# Patient Record
Sex: Male | Born: 1965
Health system: Southern US, Community
[De-identification: ages and names within clinical notes are randomized; demographics above are authoritative.]

## PROBLEM LIST (undated history)

## (undated) DIAGNOSIS — M109 Gout, unspecified: Secondary | ICD-10-CM

## (undated) DIAGNOSIS — I10 Essential (primary) hypertension: Secondary | ICD-10-CM

## (undated) HISTORY — PX: COLONOSCOPY: SHX174

---

## 2014-01-14 DIAGNOSIS — Z792 Long term (current) use of antibiotics: Secondary | ICD-10-CM | POA: Insufficient documentation

## 2014-01-14 DIAGNOSIS — M7989 Other specified soft tissue disorders: Secondary | ICD-10-CM | POA: Insufficient documentation

## 2014-01-14 DIAGNOSIS — L03119 Cellulitis of unspecified part of limb: Principal | ICD-10-CM

## 2014-01-14 DIAGNOSIS — Z791 Long term (current) use of non-steroidal anti-inflammatories (NSAID): Secondary | ICD-10-CM | POA: Insufficient documentation

## 2014-01-14 DIAGNOSIS — B353 Tinea pedis: Secondary | ICD-10-CM | POA: Insufficient documentation

## 2014-01-14 DIAGNOSIS — L02619 Cutaneous abscess of unspecified foot: Secondary | ICD-10-CM | POA: Insufficient documentation

## 2014-01-15 ENCOUNTER — Emergency Department (HOSPITAL_BASED_OUTPATIENT_CLINIC_OR_DEPARTMENT_OTHER)
Admission: EM | Admit: 2014-01-15 | Discharge: 2014-01-15 | Disposition: A | Discharge status: Home / Self Care | Payer: Self-pay | Attending: Emergency Medicine | Admitting: Emergency Medicine

## 2014-01-15 ENCOUNTER — Encounter (HOSPITAL_BASED_OUTPATIENT_CLINIC_OR_DEPARTMENT_OTHER): Payer: Self-pay | Admitting: Emergency Medicine

## 2014-01-15 DIAGNOSIS — L03119 Cellulitis of unspecified part of limb: Secondary | ICD-10-CM

## 2014-01-15 DIAGNOSIS — B353 Tinea pedis: Secondary | ICD-10-CM

## 2014-01-15 MED ORDER — NAPROXEN 375 MG PO TABS: 375.0000 mg | ORAL_TABLET | Freq: Two times a day (BID) | ORAL | Status: DC

## 2014-01-15 MED ORDER — SULFAMETHOXAZOLE-TMP DS 800-160 MG PO TABS
ORAL_TABLET | ORAL | Status: AC
  Administered 2014-01-15: 1
  Filled 2014-01-15: qty 1

## 2014-01-15 MED ORDER — IBUPROFEN 800 MG PO TABS
ORAL_TABLET | ORAL | Status: AC
  Administered 2014-01-15: 800 mg
  Filled 2014-01-15: qty 1

## 2014-01-15 MED ORDER — HYDROCODONE-ACETAMINOPHEN 5-325 MG PO TABS: 1.0000 | ORAL_TABLET | Freq: Four times a day (QID) | ORAL | Status: DC | PRN

## 2014-01-15 MED ORDER — SULFAMETHOXAZOLE-TRIMETHOPRIM 800-160 MG PO TABS: 1.0000 | ORAL_TABLET | Freq: Two times a day (BID) | ORAL | Status: DC

## 2014-01-15 NOTE — ED Notes (Signed)
Pt. Reports having swelling in the R ankle R foot and R lower leg since Sat.   Pt. Has noted moderate edema with mild pain in resting position.

## 2014-01-15 NOTE — Discharge Instructions (Signed)
Athlete's Foot  Athlete's foot is a skin infection caused by a fungus. Athlete's foot is often seen between or under the toes. It can also be seen on the bottom of the foot. Athlete's foot can spread to other people by sharing towels or shower stalls. HOME CARE  Only take medicines as told by your doctor. Do not use steroid creams.  Wash your feet daily. Dry your feet well, especially between the toes.  Change your socks every day. Wear cotton or wool socks.  Change your socks 2 to 3 times a day in hot weather.  Wear sandals or canvas tennis shoes with good airflow.  If you have blisters, soak your feet in a solution as told by your doctor. Do this for 20 to 30 minutes, 2 times a day. Dry your feet well after you soak them.  Do not share towels.  Wear sandals when you use shared locker rooms or showers. GET HELP RIGHT AWAY IF:   You have a fever.  Your foot is puffy (swollen), sore, warm, or red.  You are not getting better after 7 days of treatment.  You still have athlete's foot after 30 days.  You have problems caused by your medicine. MAKE SURE YOU:   Understand these instructions.  Will watch your condition.  Will get help right away if you are not doing well or get worse. Document Released: 10/26/2007 Document Revised: 08/01/2011 Document Reviewed: 02/25/2011 Exit[employer] Patient Information 2015 [employer]. This information is not intended to replace advice given to you by your health care provider. Make sure you discuss any questions you have with your health care provider.  

## 2014-01-15 NOTE — ED Provider Notes (Signed)
CSN: [id number]     Arrival date & time 01/14/14  2348 History   First MD Initiated Contact with Patient 01/15/14 0217     Chief Complaint  Patient presents with  . Leg Swelling     (Consider location/radiation/quality/duration/timing/severity/associated sxs/prior Treatment) Patient is a 48 y.o. male presenting with lower extremity pain. The history is provided by the patient.  Foot Pain This is a new problem. The current episode started more than 2 days ago. The problem occurs constantly. The problem has not changed since onset.Pertinent negatives include no chest pain, no abdominal pain, no headaches and no shortness of breath. Nothing aggravates the symptoms. Nothing relieves the symptoms. Treatments tried: epsom salts. The treatment provided no relief.  Redness of the dorsum of the foot with mild ache and swelling of the dorsum of the foot  History reviewed. No pertinent past medical history. History reviewed. No pertinent past surgical history. History reviewed. No pertinent family history. History  Substance Use Topics  . Smoking status: Never Smoker   . Smokeless tobacco: Not on file  . Alcohol Use: No    Review of Systems  Respiratory: Negative for shortness of breath.   Cardiovascular: Negative for chest pain.  Gastrointestinal: Negative for abdominal pain.  Musculoskeletal: Positive for arthralgias.  Skin: Positive for color change.  Neurological: Negative for headaches.  All other systems reviewed and are negative.     Allergies  Review of patient's allergies indicates no known allergies.  Home Medications   Prior to Admission medications   Medication Sig Start Date End Date Taking? Authorizing Provider  HYDROcodone-acetaminophen (NORCO) 5-325 MG per tablet Take 1-2 tablets by mouth every 6 (six) hours as needed. 01/15/14   April K Palumbo-Rasch, MD  naproxen (NAPROSYN) 375 MG tablet Take 1 tablet (375 mg total) by mouth 2 (two) times daily. 01/15/14   April K  Palumbo-Rasch, MD  sulfamethoxazole-trimethoprim (SEPTRA DS) 800-160 MG per tablet Take 1 tablet by mouth every 12 (twelve) hours. 01/15/14   April K Palumbo-Rasch, MD   BP 179/89  Pulse 62  Temp(Src) 98.5 F (36.9 C) (Oral)  Resp 18  Ht 6' (1.829 m)  Wt 240 lb (108.863 kg)  BMI 32.54 kg/m2  SpO2 100% Physical Exam  Constitutional: He is oriented to person, place, and time. He appears well-developed and well-nourished. No distress.  HENT:  Head: Normocephalic and atraumatic.  Mouth/Throat: Oropharynx is clear and moist.  Eyes: Conjunctivae are normal. Pupils are equal, round, and reactive to light.  Neck: Normal range of motion. Neck supple.  Cardiovascular: Normal rate, regular rhythm and intact distal pulses.   Pulmonary/Chest: Effort normal and breath sounds normal. He has no wheezes. He has no rales.  Abdominal: Soft. Bowel sounds are normal. There is no tenderness. There is no rebound and no guarding.  Musculoskeletal: Normal range of motion.       Right foot: He exhibits normal range of motion, no bony tenderness, normal capillary refill, no crepitus and no deformity.  Fungus of toenails of the right foot, athletes foot with significant cracking between toes of the right foot.  Cellulitis of the dorsum of the foot with warmth and erythema and mild swelling   cap refill < 2 sec right foot NVI achilles tendon intact no pain with motion of the ankle  Skin marked with sterile marking pen  Neurological: He is alert and oriented to person, place, and time. He has normal reflexes.  Skin: Skin is warm and dry. There is erythema.  Psychiatric: He has a normal mood and affect.    ED Course  Procedures (including critical care time) Labs Review Labs Reviewed - No data to display  Imaging Review No results found.   EKG Interpretation None      MDM   Final diagnoses:  Cellulitis of foot  Tinea pedis of right foot    Elevation when not on the foot, warm dial soap soaks QD  take all antibiotics.  Will prescribe NSAIDs for pain while at work and narcotic pain medication for when resting. Patient instructed he cannot drink alcohol drive a car or operate heavy machinery white taking narcotic pain medication.  Have advised patient to return immediately for fever, streaking up the leg worsening swelling numbness or vomiting.  Patient is also instructed to spray feet twice daily with tinactin to cure athletes foot as the cracking is likely how the infection entered into the tissues.  Would also use tinactin powder in shoes.  Patient verbalizes understanding and agrees to follow up    April Smitty Cords, MD 01/15/14 [phone]

## 2016-11-27 ENCOUNTER — Encounter (HOSPITAL_BASED_OUTPATIENT_CLINIC_OR_DEPARTMENT_OTHER): Payer: Self-pay | Admitting: Emergency Medicine

## 2016-11-27 ENCOUNTER — Emergency Department (HOSPITAL_BASED_OUTPATIENT_CLINIC_OR_DEPARTMENT_OTHER): Payer: Self-pay

## 2016-11-27 ENCOUNTER — Emergency Department (HOSPITAL_BASED_OUTPATIENT_CLINIC_OR_DEPARTMENT_OTHER)
Admission: EM | Admit: 2016-11-27 | Discharge: 2016-11-27 | Disposition: A | Discharge status: Home / Self Care | Payer: Self-pay | Attending: Emergency Medicine | Admitting: Emergency Medicine

## 2016-11-27 DIAGNOSIS — R609 Edema, unspecified: Secondary | ICD-10-CM

## 2016-11-27 DIAGNOSIS — R600 Localized edema: Secondary | ICD-10-CM | POA: Insufficient documentation

## 2016-11-27 DIAGNOSIS — R9431 Abnormal electrocardiogram [ECG] [EKG]: Secondary | ICD-10-CM | POA: Insufficient documentation

## 2016-11-27 DIAGNOSIS — Z79899 Other long term (current) drug therapy: Secondary | ICD-10-CM | POA: Insufficient documentation

## 2016-11-27 LAB — COMPREHENSIVE METABOLIC PANEL
ALT: 72 U/L — ABNORMAL HIGH (ref 17–63)
AST: 40 U/L (ref 15–41)
Albumin: 3.5 g/dL (ref 3.5–5.0)
Alkaline Phosphatase: 62 U/L (ref 38–126)
Anion gap: 8 (ref 5–15)
BUN: 13 mg/dL (ref 6–20)
CO2: 33 mmol/L — ABNORMAL HIGH (ref 22–32)
Calcium: 8.6 mg/dL — ABNORMAL LOW (ref 8.9–10.3)
Chloride: 99 mmol/L — ABNORMAL LOW (ref 101–111)
Creatinine, Ser: 1.18 mg/dL (ref 0.61–1.24)
GFR calc Af Amer: >60 mL/min (ref >60)
GFR calc non Af Amer: >60 mL/min (ref >60)
Glucose, Bld: 117 mg/dL — ABNORMAL HIGH (ref 65–99)
Potassium: 3.6 mmol/L (ref 3.5–5.1)
Sodium: 140 mmol/L (ref 135–145)
Total Bilirubin: 0.5 mg/dL (ref 0.3–1.2)
Total Protein: 6.8 g/dL (ref 6.5–8.1)

## 2016-11-27 LAB — CBC WITH DIFFERENTIAL/PLATELET
Basophils Absolute: 0 10*3/uL (ref 0.0–0.1)
Basophils Relative: 0 %
Eosinophils Absolute: 0.3 10*3/uL (ref 0.0–0.7)
Eosinophils Relative: 4 %
HCT: 33.5 % — ABNORMAL LOW (ref 39.0–52.0)
Hemoglobin: 11.3 g/dL — ABNORMAL LOW (ref 13.0–17.0)
Lymphocytes Relative: 19 %
Lymphs Abs: 1.2 10*3/uL (ref 0.7–4.0)
MCH: 29 pg (ref 26.0–34.0)
MCHC: 33.7 g/dL (ref 30.0–36.0)
MCV: 86.1 fL (ref 78.0–100.0)
Monocytes Absolute: 0.5 10*3/uL (ref 0.1–1.0)
Monocytes Relative: 8 %
Neutro Abs: 4.2 10*3/uL (ref 1.7–7.7)
Neutrophils Relative %: 69 %
Platelets: 178 10*3/uL (ref 150–400)
RBC: 3.89 MIL/uL — ABNORMAL LOW (ref 4.22–5.81)
RDW: 19.2 % — ABNORMAL HIGH (ref 11.5–15.5)
WBC: 6.2 10*3/uL (ref 4.0–10.5)

## 2016-11-27 LAB — URINALYSIS, ROUTINE W REFLEX MICROSCOPIC
Bilirubin Urine: NEGATIVE
Glucose, UA: NEGATIVE mg/dL
Hgb urine dipstick: NEGATIVE
Ketones, ur: NEGATIVE mg/dL
Leukocytes, UA: NEGATIVE
Nitrite: NEGATIVE
Protein, ur: NEGATIVE mg/dL
Specific Gravity, Urine: 1.009 (ref 1.005–1.030)
pH: 7 (ref 5.0–8.0)

## 2016-11-27 LAB — TROPONIN I: Troponin I: <0.03 ng/mL (ref <0.03)

## 2016-11-27 MED ORDER — FUROSEMIDE 20 MG PO TABS: 20.0000 mg | ORAL_TABLET | Freq: Every day | ORAL | 0 refills | Status: DC

## 2016-11-27 MED ORDER — FUROSEMIDE 10 MG/ML IJ SOLN
20.0000 mg | Freq: Once | INTRAMUSCULAR | Status: AC
  Administered 2016-11-27: 20 mg via INTRAVENOUS
  Filled 2016-11-27: qty 2

## 2016-11-27 NOTE — ED Provider Notes (Signed)
[facility] Provider Note   CSN: [id number] Arrival date & time: 11/27/16  1546 By signing my name below, I, Levon HedgerElizabeth Hall, attest that this documentation has been prepared under the direction and in the presence of Jacalyn LefevreHaviland, Julie, MD . Electronically Signed: Levon HedgerElizabeth Hall, Scribe. 11/27/2016. 5:35 PM.   History   Chief Complaint Chief Complaint  Patient presents with  . Leg Swelling   HPI Adrian Cole is a 51 y.o. male who presents to the Emergency Department complaining of constant, moderate bialteral ankle swelling onset two days ago. No alleviating or modifying factors noted.  He has never experienced this before. The patient is currently on no regular medications, but reports his recently started on abx per his dentist. Pt denies any leg pain, SOB, abdomen distention, or hand swelling.   The history is provided by the patient. No language interpreter was used.    History reviewed. No pertinent past medical history.  There are no active problems to display for this patient.   History reviewed. No pertinent surgical history.   Home Medications    Prior to Admission medications   Medication Sig Start Date End Date Taking? Authorizing Provider  furosemide (LASIX) 20 MG tablet Take 1 tablet (20 mg total) by mouth daily. 11/27/16   Jacalyn LefevreHaviland, Julie, MD  HYDROcodone-acetaminophen (NORCO) 5-325 MG per tablet Take 1-2 tablets by mouth every 6 (six) hours as needed. 01/15/14   Palumbo, April, MD  naproxen (NAPROSYN) 375 MG tablet Take 1 tablet (375 mg total) by mouth 2 (two) times daily. 01/15/14   Palumbo, April, MD  sulfamethoxazole-trimethoprim (SEPTRA DS) 800-160 MG per tablet Take 1 tablet by mouth every 12 (twelve) hours. 01/15/14   Palumbo, April, MD    Family History No family history on file.  Social History Social History  Substance Use Topics  . Smoking status: Never Smoker  . Smokeless tobacco: Never Used  . Alcohol use No     Allergies   Patient has  no known allergies.   Review of Systems Review of Systems All systems reviewed and are negative for acute change except as noted in the HPI.  Physical Exam Updated Vital Signs BP 109/65   Pulse 64   Temp 99.5 F (37.5 C) (Oral)   Resp (!) 28   Ht 6' (1.829 m)   Wt 111.1 kg (245 lb)   SpO2 96%   BMI 33.23 kg/m   Physical Exam  Constitutional: He is oriented to person, place, and time. He appears well-developed and well-nourished. No distress.  HENT:  Head: Normocephalic and atraumatic.  Eyes: Conjunctivae are normal.  Cardiovascular: Normal rate.   Pulmonary/Chest: Effort normal.  Abdominal: He exhibits no distension.  Musculoskeletal: He exhibits edema.  Bilateral 2+ pitting edema BLE   Neurological: He is alert and oriented to person, place, and time.  Skin: Skin is warm and dry.  Nursing note and vitals reviewed.  ED Treatments / Results  DIAGNOSTIC STUDIES:  Oxygen Saturation is 98% on RA, normal by my interpretation.    COORDINATION OF CARE:  5:32 PM Will order UA, CBC< troponin, and CMP. Discussed treatment plan with pt at bedside and pt agreed to plan.   Labs (all labs ordered are listed, but only abnormal results are displayed) Labs Reviewed  COMPREHENSIVE METABOLIC PANEL - Abnormal; Notable for the following:       Result Value   Chloride 99 (*)    CO2 33 (*)    Glucose, Bld 117 (*)  Calcium 8.6 (*)    ALT 72 (*)    All other components within normal limits  CBC WITH DIFFERENTIAL/PLATELET - Abnormal; Notable for the following:    RBC 3.89 (*)    Hemoglobin 11.3 (*)    HCT 33.5 (*)    RDW 19.2 (*)    All other components within normal limits  TROPONIN I  URINALYSIS, ROUTINE W REFLEX MICROSCOPIC    EKG  EKG Interpretation  Date/Time:  Sunday November 27 2016 17:47:51 EDT Ventricular Rate:  85 PR Interval:    QRS Duration: 85 QT Interval:  410 QTC Calculation: 488 R Axis:   53 Text Interpretation:  Sinus rhythm Consider right atrial  enlargement Abnormal T, consider ischemia, diffuse leads Minimal ST elevation, anterior leads Baseline wander in lead(s) V1 V3 No old tracing to compare Confirmed by Jacalyn Lefevre [phone]) on 11/27/2016 6:28:36 PM       Radiology Dg Chest 2 View  Result Date: 11/27/2016 CLINICAL DATA:  Bilateral leg swelling for 2 days EXAM: CHEST  2 VIEW COMPARISON:  None. FINDINGS: Mild cardiomegaly. No pleural effusion. Patchy opacity at the lingula could reflect atelectasis. No pneumothorax. IMPRESSION: 1. Mild cardiomegaly without edema or effusion 2. Patchy opacity at the lingula could reflect atelectasis. Electronically Signed   By: Jasmine Pang M.D.   On: 11/27/2016 18:31    Procedures Procedures (including critical care time)  Medications Ordered in ED Medications  furosemide (LASIX) injection 20 mg (20 mg Intravenous Given 11/27/16 1756)     Initial Impression / Assessment and Plan / ED Course  I have reviewed the triage vital signs and the nursing notes.  Pertinent labs & imaging results that were available during my care of the patient were reviewed by me and considered in my medical decision making (see chart for details).     EKG is abnormal with some T wave inversions.  He has never had an EKG in the past.  He denies any cp or sob.  He will be started on low dose lasix.  He is given the number to cardiology.  I think he probably needs an outpatient echo. He is also told to try to establish pcp.  He knows to return if worse.  Final Clinical Impressions(s) / ED Diagnoses   Final diagnoses:  Peripheral edema  Abnormal EKG    New Prescriptions New Prescriptions   FUROSEMIDE (LASIX) 20 MG TABLET    Take 1 tablet (20 mg total) by mouth daily.   I personally performed the services described in this documentation, which was scribed in my presence. The recorded information has been reviewed and is accurate.    Jacalyn Lefevre, MD 11/27/16 [phone]

## 2016-11-27 NOTE — ED Notes (Signed)
Patient transported to X-ray 

## 2016-11-27 NOTE — ED Notes (Signed)
ED Provider at bedside. 

## 2016-11-27 NOTE — ED Triage Notes (Signed)
Bilateral ankle/leg swelling x 2 days. Denies pain.

## 2020-02-18 ENCOUNTER — Other Ambulatory Visit: Payer: Self-pay

## 2020-02-18 ENCOUNTER — Emergency Department (HOSPITAL_BASED_OUTPATIENT_CLINIC_OR_DEPARTMENT_OTHER): Payer: 59

## 2020-02-18 ENCOUNTER — Emergency Department (HOSPITAL_BASED_OUTPATIENT_CLINIC_OR_DEPARTMENT_OTHER)
Admission: EM | Admit: 2020-02-18 | Discharge: 2020-02-18 | Disposition: A | Discharge status: Home / Self Care | Payer: 59 | Attending: Emergency Medicine | Admitting: Emergency Medicine

## 2020-02-18 ENCOUNTER — Encounter (HOSPITAL_BASED_OUTPATIENT_CLINIC_OR_DEPARTMENT_OTHER): Payer: Self-pay | Admitting: *Deleted

## 2020-02-18 DIAGNOSIS — M25462 Effusion, left knee: Secondary | ICD-10-CM | POA: Insufficient documentation

## 2020-02-18 DIAGNOSIS — M25562 Pain in left knee: Secondary | ICD-10-CM

## 2020-02-18 DIAGNOSIS — Z79899 Other long term (current) drug therapy: Secondary | ICD-10-CM | POA: Diagnosis not present

## 2020-02-18 MED ORDER — HYDROCODONE-ACETAMINOPHEN 5-325 MG PO TABS: 1.0000 | ORAL_TABLET | ORAL | 0 refills | Status: DC | PRN

## 2020-02-18 MED ORDER — LIDOCAINE 5 % EX PTCH: 1.0000 | MEDICATED_PATCH | CUTANEOUS | 0 refills | Status: DC

## 2020-02-18 MED ORDER — PREDNISONE 50 MG PO TABS: 50.0000 mg | ORAL_TABLET | Freq: Every day | ORAL | 0 refills | Status: AC

## 2020-02-18 NOTE — ED Provider Notes (Addendum)
[facility] EMERGENCY DEPARTMENT Provider Note   CSN: [id number] Arrival date & time: 02/18/20  1504    History Chief Complaint  Patient presents with  . Knee Pain    Adrian Cole is a 54 y.o. male with past medical history significant for lower extremity edema who presents for evaluation of left knee pain.  Patient states he noticed that he has "throbbing" pain to the anterior aspect of his left knee.  Has not noticed any edema, erythema or warmth.  Pain worsens "certain positions."  Patient states his pain is not increased when he performs range of motion exercises.  He has not been taking anything for his pain.  Was previously followed by podiatry for right toe pain and was on Voltaren however patient does not know if he is chronically on this.  He follows with [facility].  He denies fever, chills, nausea, vomiting, chest pain, shortness of breath abdominal pain, unilateral leg swelling, redness, warmth, paresthesias.  Denies additional aggravating or alleviating factors. Rates his pain a 9/10 however declines pain medication here in ED. States "write the prescription and ill take it home." Has been wearing a brace on his left knee which has helped with the pain.  History obtained from patient and past medical records.  No interpreter is used.  HPI     History reviewed. No pertinent past medical history.  There are no problems to display for this patient.   History reviewed. No pertinent surgical history.     No family history on file.  Social History   Tobacco Use  . Smoking status: Never Smoker  . Smokeless tobacco: Never Used  Substance Use Topics  . Alcohol use: No  . Drug use: No    Home Medications Prior to Admission medications   Medication Sig Start Date End Date Taking? Authorizing Provider  furosemide (LASIX) 20 MG tablet Take 1 tablet (20 mg total) by mouth daily. 11/27/16   Jacalyn Lefevre, MD  HYDROcodone-acetaminophen  (NORCO/VICODIN) 5-325 MG tablet Take 1 tablet by mouth every 4 (four) hours as needed. 02/18/20   Zakyla Tonche A, PA-C  lidocaine (LIDODERM) 5 % Place 1 patch onto the skin daily. Remove & Discard patch within 12 hours or as directed by MD 02/18/20   Demeisha Geraghty A, PA-C  naproxen (NAPROSYN) 375 MG tablet Take 1 tablet (375 mg total) by mouth 2 (two) times daily. 01/15/14   Palumbo, April, MD  predniSONE (DELTASONE) 50 MG tablet Take 1 tablet (50 mg total) by mouth daily for 5 days. 02/18/20 02/23/20  Telecia Larocque A, PA-C  sulfamethoxazole-trimethoprim (SEPTRA DS) 800-160 MG per tablet Take 1 tablet by mouth every 12 (twelve) hours. 01/15/14   Palumbo, April, MD    Allergies    Patient has no known allergies.  Review of Systems   Review of Systems  Constitutional: Negative.   HENT: Negative.   Respiratory: Negative.   Cardiovascular: Negative.   Gastrointestinal: Negative.   Genitourinary: Negative.   Musculoskeletal:       Left knee  Skin: Negative.   Neurological: Negative.   All other systems reviewed and are negative.   Physical Exam Updated Vital Signs BP (!) 140/95   Pulse 80   Temp 99.4 F (37.4 C)   Resp 18   Ht 5\' 11"  (1.803 m)   Wt 117.9 kg   SpO2 100%   BMI 36.26 kg/m   Physical Exam Vitals and nursing note reviewed.  Constitutional:  General: He is not in acute distress.    Appearance: He is well-developed. He is not ill-appearing, toxic-appearing or diaphoretic.  HENT:     Head: Normocephalic and atraumatic.     Nose: Nose normal.     Mouth/Throat:     Mouth: Mucous membranes are moist.  Eyes:     Pupils: Pupils are equal, round, and reactive to light.  Cardiovascular:     Rate and Rhythm: Normal rate and regular rhythm.     Pulses: Normal pulses.     Heart sounds: Normal heart sounds.  Pulmonary:     Effort: Pulmonary effort is normal. No respiratory distress.     Breath sounds: Normal breath sounds.  Abdominal:     General: Bowel  sounds are normal. There is no distension.     Palpations: Abdomen is soft.  Musculoskeletal:        General: Normal range of motion.     Cervical back: Normal range of motion and neck supple.     Right knee: Normal.     Left knee: Effusion present. No swelling, deformity, erythema, ecchymosis or lacerations. Normal range of motion. Tenderness present.     Comments: 1+ pitting edema to BL extremities to mid shin.  Patient able to flex no pain with flexion or extension to left knee.  No bony tenderness to right femur, patella, tibia or fibula.  Wiggles toes without difficulty.  No overlying skin changes.  There is joint effusion to right knee.  Negative varus, valgus stress.  Negative anterior drawer.  Skin:    General: Skin is warm and dry.     Capillary Refill: Capillary refill takes less than 2 seconds.     Comments: No erythema, warmth, fluctuance or induration.  Neurological:     Mental Status: He is alert.     Cranial Nerves: Cranial nerves are intact.     Sensory: Sensation is intact.     Motor: Motor function is intact.     Coordination: Coordination is intact.     Gait: Gait is intact.     Comments: Ambulatory in ED without difficulty.     ED Results / Procedures / Treatments   Labs (all labs ordered are listed, but only abnormal results are displayed) Labs Reviewed - No data to display  EKG None  Radiology DG Knee Complete 4 Views Left  Result Date: 02/18/2020 CLINICAL DATA:  Atraumatic anterior and medial left knee pain. EXAM: LEFT KNEE - COMPLETE 4+ VIEW COMPARISON:  None. FINDINGS: No evidence of an acute fracture or dislocation. Chronic and/or degenerative changes seen along the lateral aspect of the distal left femur. There is a moderate-sized joint effusion. IMPRESSION: Moderate-sized joint effusion without evidence of acute fracture. Electronically Signed   By: Aram Candela M.D.   On: 02/18/2020 15:58    Procedures Procedures (including critical care  time)  Medications Ordered in ED Medications - No data to display  ED Course  I have reviewed the triage vital signs and the nursing notes.  Pertinent labs & imaging results that were available during my care of the patient were reviewed by me and considered in my medical decision making (see chart for details).  54 year old male presents for evaluation of nontraumatic left knee pain.  He is afebrile, nonseptic, not ill-appearing.  He has full range of motion with flexion and extension.  No pain with range of motion.  Is negative varus, valgus stress.  Negative anterior drawer.  No bony tenderness to left  lower extremity.  No tenderness to popliteal fossa.  Does have 1+ pitting edema to bilateral lower extremities to mid shin however no unilateral leg swelling, redness or warmth.  His Denna Haggard' sign is negative.  He is neurovascularly intact.  Low suspicion for VTE.  Patient has no erythema, warmth, fluctuance or induration.  Does have effusion to left knee however given no pain with range of motion, overlying skin changes have low suspicion for septic joint.  X-ray here shows joint effusion consistent with exam. Offered arthrocentesis here in ED however patient states he does not have "time for this." Will treat symptomatically and have him follow up with Orthopedics. He can return for new or worsening symptoms. Ambulatory in ED without difficulty. Low suspicion acute fracture, dislocation, myositis, thrombosis, hemarthrosis, septic joint.  The patient has been appropriately medically screened and/or stabilized in the ED. I have low suspicion for any other emergent medical condition which would require further screening, evaluation or treatment in the ED or require inpatient management.  Patient is hemodynamically stable and in no acute distress.  Patient able to ambulate in department prior to ED.  Evaluation does not show acute pathology that would require ongoing or additional emergent interventions  while in the emergency department or further inpatient treatment.  I have discussed the diagnosis with the patient and answered all questions.  Pain is been managed while in the emergency department and patient has no further complaints prior to discharge.  Patient is comfortable with plan discussed in room and is stable for discharge at this time.  I have discussed strict return precautions for returning to the emergency department.  Patient was encouraged to follow-up with PCP/specialist refer to at discharge.    MDM Rules/Calculators/A&P                           Final Clinical Impression(s) / ED Diagnoses Final diagnoses:  Acute pain of left knee    Rx / DC Orders ED Discharge Orders         Ordered    predniSONE (DELTASONE) 50 MG tablet  Daily        02/18/20 1614    HYDROcodone-acetaminophen (NORCO/VICODIN) 5-325 MG tablet  Every 4 hours PRN        02/18/20 1614    lidocaine (LIDODERM) 5 %  Every 24 hours        02/18/20 1614           Urias Sheek A, PA-C 02/18/20 1619    Nicolet Griffy A, PA-C 02/18/20 1707    Terrilee Files, MD 02/19/20 1110

## 2020-02-18 NOTE — Discharge Instructions (Signed)
Call to schedule an appointment with the Orthopedist  Take the medications as prescribed  Return for new or worsening symptoms

## 2020-02-18 NOTE — ED Triage Notes (Signed)
C/o left knee pain x 2 days denies injury

## 2020-04-02 ENCOUNTER — Emergency Department (HOSPITAL_BASED_OUTPATIENT_CLINIC_OR_DEPARTMENT_OTHER): Payer: 59

## 2020-04-02 ENCOUNTER — Other Ambulatory Visit: Payer: Self-pay

## 2020-04-02 ENCOUNTER — Emergency Department (HOSPITAL_BASED_OUTPATIENT_CLINIC_OR_DEPARTMENT_OTHER)
Admission: EM | Admit: 2020-04-02 | Discharge: 2020-04-02 | Disposition: A | Discharge status: Home / Self Care | Payer: 59 | Attending: Emergency Medicine | Admitting: Emergency Medicine

## 2020-04-02 ENCOUNTER — Other Ambulatory Visit (HOSPITAL_BASED_OUTPATIENT_CLINIC_OR_DEPARTMENT_OTHER): Payer: Self-pay | Admitting: Physician Assistant

## 2020-04-02 ENCOUNTER — Encounter (HOSPITAL_BASED_OUTPATIENT_CLINIC_OR_DEPARTMENT_OTHER): Payer: Self-pay | Admitting: Emergency Medicine

## 2020-04-02 DIAGNOSIS — R2242 Localized swelling, mass and lump, left lower limb: Secondary | ICD-10-CM | POA: Diagnosis not present

## 2020-04-02 DIAGNOSIS — M2540 Effusion, unspecified joint: Secondary | ICD-10-CM

## 2020-04-02 DIAGNOSIS — M25462 Effusion, left knee: Secondary | ICD-10-CM | POA: Diagnosis not present

## 2020-04-02 DIAGNOSIS — Y990 Civilian activity done for income or pay: Secondary | ICD-10-CM | POA: Diagnosis not present

## 2020-04-02 DIAGNOSIS — M25562 Pain in left knee: Secondary | ICD-10-CM | POA: Diagnosis present

## 2020-04-02 MED ORDER — PREDNISONE 50 MG PO TABS: 50.0000 mg | ORAL_TABLET | Freq: Every day | ORAL | 0 refills | Status: DC

## 2020-04-02 MED ORDER — HYDROCODONE-ACETAMINOPHEN 5-325 MG PO TABS: 1.0000 | ORAL_TABLET | Freq: Four times a day (QID) | ORAL | 0 refills | Status: DC | PRN

## 2020-04-02 MED ORDER — IBUPROFEN 600 MG PO TABS: 600.0000 mg | ORAL_TABLET | Freq: Four times a day (QID) | ORAL | 0 refills | Status: DC | PRN

## 2020-04-02 MED FILL — IBUPROFEN 600 MG TABLET: 600 | 7 days supply | Qty: 30 | Fill #0

## 2020-04-02 MED FILL — predniSONE 50 MG TABS: 50 | 4 days supply | Qty: 4 | Fill #0

## 2020-04-02 MED FILL — HYDROCODON-APAP 5-325: 5-325 | 2 days supply | Qty: 5 | Fill #0

## 2020-04-02 NOTE — Discharge Instructions (Signed)
Please take your medications, as directed.  Only take Vicodin as needed for breakthrough pain.  Please note that it can make you very drowsy, do not work or drive while taking Vicodin.  I would like you to follow-up with your primary care provider regarding today's encounter for ongoing evaluation.  I would also like for you to follow-up with Dr. Jordan Likes, orthopedics, given that your knee effusion is recurrent.  I recommend that you continue applying ice and keeping the leg elevated.  Please return to the ED or seek immediate medical attention should you experience any new or worsening symptoms.

## 2020-04-02 NOTE — ED Provider Notes (Signed)
[facility] EMERGENCY DEPARTMENT Provider Note   CSN: [id number] Arrival date & time: 04/02/20  1310     History Chief Complaint  Patient presents with  . Knee Pain    Adrian Cole is a 54 y.o. male with no relevant past medical history presents the ED with complaints of left knee pain.  I reviewed patient's medical record and he had been evaluated here at [facility] on 02/18/2020 for acute onset throbbing pain in his left knee.  He states that it was at the time unprovoked.  The assessing provider had low suspicion for septic arthritis or VTE and he was ultimately discharged home with a course of prednisone and Vicodin in addition to topical Lidoderm.  On my examination, patient states that his symptoms had improved after receiving the prednisone.  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  He states that the incident has reaggravated his symptoms of discomfort and swelling.  He also states that he has noticed some mild swelling in his left leg.  He denies any fevers or chills, chest pain or shortness of breath, history of IVDA or other illicit drug use, recent STI, numbness or weakness, or other symptoms.  He is ambulatory, albeit with antalgia.  HPI     History reviewed. No pertinent past medical history.  There are no problems to display for this patient.   History reviewed. No pertinent surgical history.     No family history on file.  Social History   Tobacco Use  . Smoking status: Never Smoker  . Smokeless tobacco: Never Used  Substance Use Topics  . Alcohol use: No  . Drug use: No    Home Medications Prior to Admission medications   Medication Sig Start Date End Date Taking? Authorizing Provider  furosemide (LASIX) 20 MG tablet Take 1 tablet (20 mg total) by mouth daily. 11/27/16   Jacalyn Lefevre, MD  HYDROcodone-acetaminophen (NORCO/VICODIN) 5-325 MG tablet Take 1  tablet by mouth every 4 (four) hours as needed. 02/18/20   Henderly, Britni A, PA-C  lidocaine (LIDODERM) 5 % Place 1 patch onto the skin daily. Remove & Discard patch within 12 hours or as directed by MD 02/18/20   Henderly, Britni A, PA-C  naproxen (NAPROSYN) 375 MG tablet Take 1 tablet (375 mg total) by mouth 2 (two) times daily. 01/15/14   Palumbo, April, MD  sulfamethoxazole-trimethoprim (SEPTRA DS) 800-160 MG per tablet Take 1 tablet by mouth every 12 (twelve) hours. 01/15/14   Palumbo, April, MD    Allergies    Patient has no known allergies.  Review of Systems   Review of Systems  Constitutional: Negative for chills and fever.  Musculoskeletal: Positive for arthralgias, gait problem and joint swelling.  Skin: Negative for color change and wound.  Neurological: Negative for weakness and numbness.    Physical Exam Updated Vital Signs BP 128/71   Pulse 66   Temp 99.6 F (37.6 C) (Oral)   Resp 16   Ht 6' (1.829 m)   Wt 117.9 kg   SpO2 100%   BMI 35.26 kg/m   Physical Exam Vitals and nursing note reviewed. Exam conducted with a chaperone present.  Constitutional:      General: He is not in acute distress.    Appearance: He is not ill-appearing.     Comments: Mildly disheveled.  HENT:     Head: Normocephalic and atraumatic.  Eyes:  General: No scleral icterus.    Conjunctiva/sclera: Conjunctivae normal.  Cardiovascular:     Rate and Rhythm: Normal rate.     Pulses: Normal pulses.  Pulmonary:     Effort: Pulmonary effort is normal.  Musculoskeletal:     Cervical back: Normal range of motion.     Comments: Left knee: Mild swelling relative to contralateral side.  Able to flex to 90 degrees and extend to 180 degrees.  No significant overlying redness or warmth noted on exam.  Mild effusion noted. Left shin: Mild swelling relative to contralateral leg.  Is pitting edema is much more pronounced on the lower left leg, 3+ compared to 1+.  See picture.  No calf tenderness.   Negative Homans' sign. Left ankle: Normal.  Pedal pulse intact.  Wiggles all toes. Left hip: Normal.  ROM and strength intact.  Skin:    General: Skin is dry.     Capillary Refill: Capillary refill takes less than 2 seconds.  Neurological:     Mental Status: He is alert and oriented to person, place, and time.     GCS: GCS eye subscore is 4. GCS verbal subscore is 5. GCS motor subscore is 6.  Psychiatric:        Mood and Affect: Mood normal.        Behavior: Behavior normal.        Thought Content: Thought content normal.       ED Results / Procedures / Treatments   Labs (all labs ordered are listed, but only abnormal results are displayed) Labs Reviewed - No data to display  EKG None  Radiology DG Knee Complete 4 Views Left  Result Date: 04/02/2020 CLINICAL DATA:  Larey Seat 1 week ago with medial knee pain. EXAM: LEFT KNEE - COMPLETE 4+ VIEW COMPARISON:  None. FINDINGS: Moderate knee joint effusion. No evidence of fracture. Mild medial compartment degenerative changes with mild joint space narrowing and small marginal osteophytes. No evidence fracture. IMPRESSION: Moderate knee joint effusion. Mild medial compartment degenerative changes. No acute bone finding. Electronically Signed   By: Paulina Fusi M.D.   On: 04/02/2020 13:55    Procedures Procedures (including critical care time)  Medications Ordered in ED Medications - No data to display  ED Course  I have reviewed the triage vital signs and the nursing notes.  Pertinent labs & imaging results that were available during my care of the patient were reviewed by me and considered in my medical decision making (see chart for details).    MDM Rules/Calculators/A&P                          Patient's history and physical exam is suggestive of a traumatic joint effusion given that he recently landed on it, irritating a prior effusion for which she was recently evaluated.  He states that his symptoms have improved considerably  with the prednisone.  If it was septic arthritis previously, it would not have improved considerably with steroids and topical analgesics.  I have low suspicion for a gout versus pseudogout in this instance.  He is able to flex and extend his knee quite well, only mildly limited due to the swelling.  Plain films of his knee are obtained and demonstrate a moderate joint effusion, but no acute findings.  We will also proceed with DVT study given that he has considerable pitting edema on the left leg relative to his right.  DVT study was negative for DVT.  Patient has been icing his knee, but has not taken any other medications.  He does not want any medications here in the ED for pain given that he is driving home, but is asking for similar medications to which he was prescribed last time as they worked well.  I informed him that I will prescribe him a short course of Vicodin, but emphasized the importance of first using NSAIDs and prednisone burst.  We will also provide patient with a referral to Dr. Jordan Likes here at [facility] because he did not want to travel to [facility] for orthopedic follow-up.  Patient declines brace or crutches.  Patient is amatory without too much difficulty.  ED return precautions discussed.  Patient voices understanding and is agreeable to the plan.  Final Clinical Impression(s) / ED Diagnoses Final diagnoses:  None    Rx / DC Orders ED Discharge Orders    None       Lorelee New, PA-C 04/17/20 2332    Melene Plan, DO 04/20/20 0700

## 2020-04-02 NOTE — ED Triage Notes (Signed)
L knee pain after falling at work last week.

## 2020-04-07 ENCOUNTER — Ambulatory Visit: Payer: Self-pay

## 2020-04-07 ENCOUNTER — Ambulatory Visit (INDEPENDENT_AMBULATORY_CARE_PROVIDER_SITE_OTHER): Payer: 59 | Admitting: Family Medicine

## 2020-04-07 ENCOUNTER — Encounter: Payer: Self-pay | Admitting: Family Medicine

## 2020-04-07 ENCOUNTER — Other Ambulatory Visit: Payer: Self-pay

## 2020-04-07 VITALS — BP 128/74 | HR 67 | Ht 72.0 in | Wt 260.0 lb

## 2020-04-07 DIAGNOSIS — M25562 Pain in left knee: Secondary | ICD-10-CM

## 2020-04-07 DIAGNOSIS — M11262 Other chondrocalcinosis, left knee: Secondary | ICD-10-CM | POA: Diagnosis not present

## 2020-04-07 NOTE — Progress Notes (Signed)
Adrian Cole - 54 y.o. male MRN [id number]  Date of birth: 11/09/1965  SUBJECTIVE:  Including CC & ROS.  Chief Complaint  Patient presents with  . Knee Pain    left x 1 month    Adrian Cole is a 54 y.o. male that is  Presenting with left knee pain. His pain has improved after the prednisone and ibuprofen. No history of similar pain. Initially the pain started with on trigger. He did have a twisting mechanism of injury a few weeks after it initially started. No prior surgery. Localized to the knee with swelling of the lower leg.  Independent review of the left knee x-ray from 11/12 shows mild medial joint space narrowing   Review of Systems See HPI   HISTORY: Past Medical, Surgical, Social, and Family History Reviewed & Updated per EMR.   Pertinent Historical Findings include:  No past medical history on file.  No past surgical history on file.  No family history on file.  Social History   Socioeconomic History  . Marital status: Single    Spouse name: Not on file  . Number of children: Not on file  . Years of education: Not on file  . Highest education level: Not on file  Occupational History  . Not on file  Tobacco Use  . Smoking status: Never Smoker  . Smokeless tobacco: Never Used  Substance and Sexual Activity  . Alcohol use: No  . Drug use: No  . Sexual activity: Not on file  Other Topics Concern  . Not on file  Social History Narrative  . Not on file   Social Determinants of Health   Financial Resource Strain:   . Difficulty of Paying Living Expenses: Not on file  Food Insecurity:   . Worried About Programme researcher, broadcasting/film/video in the Last Year: Not on file  . Ran Out of Food in the Last Year: Not on file  Transportation Needs:   . Lack of Transportation (Medical): Not on file  . Lack of Transportation (Non-Medical): Not on file  Physical Activity:   . Days of Exercise per Week: Not on file  . Minutes of Exercise per Session: Not on file  Stress:   .  Feeling of Stress : Not on file  Social Connections:   . Frequency of Communication with Friends and Family: Not on file  . Frequency of Social Gatherings with Friends and Family: Not on file  . Attends Religious Services: Not on file  . Active Member of Clubs or Organizations: Not on file  . Attends Banker Meetings: Not on file  . Marital Status: Not on file  Intimate Partner Violence:   . Fear of Current or Ex-Partner: Not on file  . Emotionally Abused: Not on file  . Physically Abused: Not on file  . Sexually Abused: Not on file     PHYSICAL EXAM:  VS: BP 128/74   Pulse 67   Ht 6' (1.829 m)   Wt 260 lb (117.9 kg)   BMI 35.26 kg/m  Physical Exam Gen: NAD, alert, cooperative with exam, well-appearing MSK:  Left knee:  Mild effusion  Normal range of motion  No instability  Normal strength to resistance.  Neurovascularly intact    Limited ultrasound: Left knee:  Mild effusion in suprapatellar pouch. Normal-appearing quadricep patellar tendon. Hyperechoic densities within the medial meniscus to suggest pseudogout. Degenerative changes of the medial meniscus and joint space. Increased hyperemia observed medially as well as through the suprapatellar  pouch.  Summary: Findings suggest possible pseudogout.  Ultrasound and interpretation by Clare Gandy, MD    ASSESSMENT & PLAN:   Pseudogout of left knee Has changes in the medial aspect to suggest possible pseudogout.  He has increased hyperemia throughout the medial compartment.  Does have degenerative changes that are mild.  No pain today. -Counseled on home exercise therapy and supportive care. -Counseled on ibuprofen. -Could consider lab work or injection.

## 2020-04-07 NOTE — Patient Instructions (Signed)
Nice to meet you Please try ice  Please try the exercises  Please continue the ibuprofen for one more week and then as needed   Please send me a message in MyChart with any questions or updates.  Please see me back in 4 weeks.   --Dr. Jordan Likes

## 2020-04-07 NOTE — Assessment & Plan Note (Signed)
Has changes in the medial aspect to suggest possible pseudogout.  He has increased hyperemia throughout the medial compartment.  Does have degenerative changes that are mild.  No pain today. -Counseled on home exercise therapy and supportive care. -Counseled on ibuprofen. -Could consider lab work or injection.

## 2020-04-26 ENCOUNTER — Emergency Department (HOSPITAL_BASED_OUTPATIENT_CLINIC_OR_DEPARTMENT_OTHER)
Admission: EM | Admit: 2020-04-26 | Discharge: 2020-04-26 | Disposition: A | Discharge status: Home / Self Care | Payer: 59 | Attending: Emergency Medicine | Admitting: Emergency Medicine

## 2020-04-26 ENCOUNTER — Other Ambulatory Visit: Payer: Self-pay

## 2020-04-26 ENCOUNTER — Encounter (HOSPITAL_BASED_OUTPATIENT_CLINIC_OR_DEPARTMENT_OTHER): Payer: Self-pay | Admitting: Emergency Medicine

## 2020-04-26 ENCOUNTER — Emergency Department (HOSPITAL_BASED_OUTPATIENT_CLINIC_OR_DEPARTMENT_OTHER): Payer: 59

## 2020-04-26 DIAGNOSIS — M25571 Pain in right ankle and joints of right foot: Secondary | ICD-10-CM | POA: Insufficient documentation

## 2020-04-26 MED ORDER — INDOMETHACIN 50 MG PO CAPS: 50.0000 mg | ORAL_CAPSULE | Freq: Two times a day (BID) | ORAL | 0 refills | Status: AC

## 2020-04-26 NOTE — ED Triage Notes (Signed)
Pt arrives pov with c/o right ankle pain and swelling x 3 days. Pt denies injury. Swelling and tenderness noted

## 2020-04-26 NOTE — ED Provider Notes (Signed)
[facility] EMERGENCY DEPARTMENT Provider Note   CSN: [id number] Arrival date & time: 04/26/20  1123     History Chief Complaint  Patient presents with  . Ankle Pain    Adrian Cole is a 54 y.o. male.  54 year old male presents with complaint of right ankle pain and swelling x2 days. Patient states that he had his usual day at work on Thursday, woke up Friday morning with his right ankle swollen with pain to the medial ankle. Patient denies any skin changes, wounds, injuries or changes in diet. No history of gout although states he had similar pain in his left ankle about a year ago. No other complaints or concerns today.        History reviewed. No pertinent past medical history.  Patient Active Problem List   Diagnosis Date Noted  . Pseudogout of left knee 04/07/2020    History reviewed. No pertinent surgical history.     History reviewed. No pertinent family history.  Social History   Tobacco Use  . Smoking status: Never Smoker  . Smokeless tobacco: Never Used  Substance Use Topics  . Alcohol use: No  . Drug use: No    Home Medications Prior to Admission medications   Medication Sig Start Date End Date Taking? Authorizing Provider  furosemide (LASIX) 20 MG tablet Take 1 tablet (20 mg total) by mouth daily. 11/27/16   Jacalyn Lefevre, MD  indomethacin (INDOCIN) 50 MG capsule Take 1 capsule (50 mg total) by mouth 2 (two) times daily with a meal for 7 days. 04/26/20 05/03/20  Army Melia A, PA-C  lidocaine (LIDODERM) 5 % Place 1 patch onto the skin daily. Remove & Discard patch within 12 hours or as directed by MD 02/18/20   Henderly, Britni A, PA-C    Allergies    Patient has no known allergies.  Review of Systems   Review of Systems  Constitutional: Negative for fever.  Musculoskeletal: Positive for arthralgias, joint swelling and myalgias.  Skin: Negative for color change, rash and wound.  Allergic/Immunologic: Negative for immunocompromised  state.  Neurological: Negative for weakness and numbness.  Hematological: Does not bruise/bleed easily.  Psychiatric/Behavioral: Negative for self-injury.  All other systems reviewed and are negative.   Physical Exam Updated Vital Signs BP 135/85 (BP Location: Right Arm)   Pulse 77   Temp 98.2 F (36.8 C) (Oral)   Resp 16   Ht 6' (1.829 m)   Wt 117.9 kg   SpO2 100%   BMI 35.26 kg/m   Physical Exam Vitals and nursing note reviewed.  Constitutional:      General: He is not in acute distress.    Appearance: He is well-developed. He is not diaphoretic.  HENT:     Head: Normocephalic and atraumatic.  Cardiovascular:     Pulses: Normal pulses.  Pulmonary:     Effort: Pulmonary effort is normal.  Musculoskeletal:        General: Swelling and tenderness present. No deformity.     Right lower leg: No edema.     Left lower leg: No edema.     Comments: Swelling to the medial right ankle without overlying erythema or skin changes. No specific point tenderness, pain worse with range of motion of ankle. DP pulse present, sensation intact.  Skin:    General: Skin is warm and dry.     Capillary Refill: Capillary refill takes less than 2 seconds.     Findings: No bruising, erythema, lesion or rash.  Neurological:  Mental Status: He is alert and oriented to person, place, and time.     Sensory: No sensory deficit.  Psychiatric:        Behavior: Behavior normal.     ED Results / Procedures / Treatments   Labs (all labs ordered are listed, but only abnormal results are displayed) Labs Reviewed - No data to display  EKG None  Radiology DG Ankle Complete Right  Result Date: 04/26/2020 CLINICAL DATA:  Medial ankle pain for 3 days, no known injury. EXAM: RIGHT ANKLE - COMPLETE 3+ VIEW COMPARISON:  None. FINDINGS: Mild soft tissue swelling is noted. No acute fracture or dislocation is noted. Calcaneal spurring is noted. IMPRESSION: Soft tissue swelling without acute bony  abnormality. Electronically Signed   By: Alcide Clever M.D.   On: 04/26/2020 12:22    Procedures Procedures (including critical care time)  Medications Ordered in ED Medications - No data to display  ED Course  I have reviewed the triage vital signs and the nursing notes.  Pertinent labs & imaging results that were available during my care of the patient were reviewed by me and considered in my medical decision making (see chart for details).  Clinical Course as of Apr 26 1436  Sun Apr 26, 2020  4338 54 year old male with right ankle pain x2 days without injury. X-ray shows soft tissue swelling, otherwise unremarkable. Pain worse with range of motion of the ankle, vague tenderness with palpation medial ankle. No overlying erythema to suggest septic joint, consider possible gout versus sprain versus tendinitis. Plan is to place on short course of indomethacin, patient has crutches, recommend weight-bear as tolerated, elevate leg and recheck with PCP in 2 days.   [LM]    Clinical Course User Index [LM] Alden Hipp   MDM Rules/Calculators/A&P                          Final Clinical Impression(s) / ED Diagnoses Final diagnoses:  Acute right ankle pain    Rx / DC Orders ED Discharge Orders         Ordered    indomethacin (INDOCIN) 50 MG capsule  2 times daily with meals        04/26/20 1434           Jeannie Fend, PA-C 04/26/20 1437    Terald Sleeper, MD 04/27/20 1118

## 2020-04-26 NOTE — Discharge Instructions (Signed)
Take indomethacin as prescribed. Elevate your leg, weight-bear as tolerated. Recheck with your doctor in 2 days if not improving.

## 2020-05-06 ENCOUNTER — Ambulatory Visit: Payer: 59 | Admitting: Family Medicine

## 2020-05-11 ENCOUNTER — Other Ambulatory Visit: Payer: Self-pay | Admitting: Family Medicine

## 2020-05-11 ENCOUNTER — Other Ambulatory Visit: Payer: Self-pay

## 2020-05-11 ENCOUNTER — Ambulatory Visit (INDEPENDENT_AMBULATORY_CARE_PROVIDER_SITE_OTHER): Payer: 59 | Admitting: Family Medicine

## 2020-05-11 ENCOUNTER — Encounter (INDEPENDENT_AMBULATORY_CARE_PROVIDER_SITE_OTHER): Payer: Self-pay

## 2020-05-11 ENCOUNTER — Ambulatory Visit: Payer: Self-pay

## 2020-05-11 VITALS — BP 118/76 | Ht 72.0 in | Wt 260.0 lb

## 2020-05-11 DIAGNOSIS — M11262 Other chondrocalcinosis, left knee: Secondary | ICD-10-CM | POA: Diagnosis not present

## 2020-05-11 DIAGNOSIS — M10072 Idiopathic gout, left ankle and foot: Secondary | ICD-10-CM | POA: Diagnosis not present

## 2020-05-11 DIAGNOSIS — M79675 Pain in left toe(s): Secondary | ICD-10-CM

## 2020-05-11 MED ORDER — PREDNISONE 20 MG PO TABS: ORAL_TABLET | ORAL | 0 refills | Status: DC

## 2020-05-11 MED FILL — predniSONE 20 MG TABS: 20 | 10 days supply | Qty: 18 | Fill #0

## 2020-05-11 NOTE — Assessment & Plan Note (Signed)
Acute pain of the first MTP joint.  Seems more related to gout. -Counseled supportive care. -Prednisone. -CBC, CMP, ANA, sed rate, CRP and uric acid

## 2020-05-11 NOTE — Assessment & Plan Note (Signed)
Symptoms of the knee may be associated with gout as opposed to pseudogout.  He is having changes today to suggest more gouty source change in his left great toe. -Could consider aspiration injection if the symptoms return.

## 2020-05-11 NOTE — Progress Notes (Signed)
  Adrian Cole - 54 y.o. male MRN [id number]  Date of birth: 09/26/1965  SUBJECTIVE:  Including CC & ROS.  No chief complaint on file.   Adrian Cole is a 54 y.o. male that is following up for his left knee and presenting with acute left great toe pain.  His left knee is doing well but his great toe is significantly tender and painful.  It was so painful he is unable to have anything tested the other night.  Denies a history of gout.  Has not tried any medications for the pain.   Review of Systems See HPI   HISTORY: Past Medical, Surgical, Social, and Family History Reviewed & Updated per EMR.   Pertinent Historical Findings include:  No past medical history on file.  No past surgical history on file.  No family history on file.  Social History   Socioeconomic History  . Marital status: Single    Spouse name: Not on file  . Number of children: Not on file  . Years of education: Not on file  . Highest education level: Not on file  Occupational History  . Not on file  Tobacco Use  . Smoking status: Never Smoker  . Smokeless tobacco: Never Used  Substance and Sexual Activity  . Alcohol use: No  . Drug use: No  . Sexual activity: Not on file  Other Topics Concern  . Not on file  Social History Narrative  . Not on file   Social Determinants of Health   Financial Resource Strain: Not on file  Food Insecurity: Not on file  Transportation Needs: Not on file  Physical Activity: Not on file  Stress: Not on file  Social Connections: Not on file  Intimate Partner Violence: Not on file     PHYSICAL EXAM:  VS: BP 118/76   Ht 6' (1.829 m)   Wt 260 lb (117.9 kg)   BMI 35.26 kg/m  Physical Exam Gen: NAD, alert, cooperative with exam, well-appearing MSK:  Left great toe: Significant swelling of the first MTP joint. Limited flexion extension of the great toe. Tenderness to palpation over the first MTP joint. Warmth and redness over the first MTP  joint. Neurovascularly intact  Limited ultrasound: Left great toe:  Effusion and hyperemia associated with the first MTP joint.   Enlarged synovium.   No cobblestoning  Summary: Findings indicate acute gout.  Ultrasound and interpretation by Clare Gandy, MD    ASSESSMENT & PLAN:   Acute idiopathic gout involving toe of left foot Acute pain of the first MTP joint.  Seems more related to gout. -Counseled supportive care. -Prednisone. -CBC, CMP, ANA, sed rate, CRP and uric acid  Pseudogout of left knee Symptoms of the knee may be associated with gout as opposed to pseudogout.  He is having changes today to suggest more gouty source change in his left great toe. -Could consider aspiration injection if the symptoms return.

## 2020-05-11 NOTE — Patient Instructions (Signed)
Good to see you Please try ice if needed  I will call with the results.   Please send me a message in MyChart with any questions or updates.  Please see me back in 4 weeks.   --Dr. Jordan Likes

## 2020-05-12 ENCOUNTER — Telehealth: Payer: Self-pay | Admitting: Family Medicine

## 2020-05-12 DIAGNOSIS — N1832 Chronic kidney disease, stage 3b: Secondary | ICD-10-CM

## 2020-05-12 NOTE — Telephone Encounter (Signed)
Left VM for patient. If he calls back please have him speak with a nurse/CMA and inform this his labs show that his kidney function is lower than it should be so we will be referring him to a kidney doctor. His labs also suggest gout as a source of his pain.   If any questions then please take the best time and phone number to call and I will try to call him back.   Myra Rude, MD [facility] 05/12/2020, 5:37 PM

## 2020-05-13 LAB — COMPREHENSIVE METABOLIC PANEL
ALT: 11 IU/L (ref 0–44)
AST: 10 IU/L (ref 0–40)
Albumin/Globulin Ratio: 1.9 (ref 1.2–2.2)
Albumin: 4.5 g/dL (ref 3.8–4.9)
Alkaline Phosphatase: 85 IU/L (ref 44–121)
BUN/Creatinine Ratio: 10 (ref 9–20)
BUN: 20 mg/dL (ref 6–24)
Bilirubin Total: 0.3 mg/dL (ref 0.0–1.2)
CO2: 23 mmol/L (ref 20–29)
Calcium: 9.1 mg/dL (ref 8.7–10.2)
Chloride: 104 mmol/L (ref 96–106)
Creatinine, Ser: 1.97 mg/dL — ABNORMAL HIGH (ref 0.76–1.27)
GFR calc Af Amer: 44 mL/min/{1.73_m2} — ABNORMAL LOW (ref >59)
GFR calc non Af Amer: 38 mL/min/{1.73_m2} — ABNORMAL LOW (ref >59)
Globulin, Total: 2.4 g/dL (ref 1.5–4.5)
Glucose: 123 mg/dL — ABNORMAL HIGH (ref 65–99)
Potassium: 4.3 mmol/L (ref 3.5–5.2)
Sodium: 139 mmol/L (ref 134–144)
Total Protein: 6.9 g/dL (ref 6.0–8.5)

## 2020-05-13 LAB — CBC
Hematocrit: 31.7 % — ABNORMAL LOW (ref 37.5–51.0)
Hemoglobin: 10.2 g/dL — ABNORMAL LOW (ref 13.0–17.7)
MCH: 27.4 pg (ref 26.6–33.0)
MCHC: 32.2 g/dL (ref 31.5–35.7)
MCV: 85 fL (ref 79–97)
Platelets: 254 10*3/uL (ref 150–450)
RBC: 3.72 x10E6/uL — ABNORMAL LOW (ref 4.14–5.80)
RDW: 17.5 % — ABNORMAL HIGH (ref 11.6–15.4)
WBC: 7.6 10*3/uL (ref 3.4–10.8)

## 2020-05-13 LAB — ANA,IFA RA DIAG PNL W/RFLX TIT/PATN
ANA Titer 1: NEGATIVE
Cyclic Citrullin Peptide Ab: 6 units (ref 0–19)
Rheumatoid fact SerPl-aCnc: <10 IU/mL (ref <14.0)

## 2020-05-13 LAB — URIC ACID: Uric Acid: 9.3 mg/dL — ABNORMAL HIGH (ref 3.8–8.4)

## 2020-05-13 LAB — C-REACTIVE PROTEIN: CRP: 18 mg/L — ABNORMAL HIGH (ref 0–10)

## 2020-05-13 LAB — SEDIMENTATION RATE: Sed Rate: 55 mm/hr — ABNORMAL HIGH (ref 0–30)

## 2020-05-13 NOTE — Telephone Encounter (Signed)
Faxed notes to [facility] at [phone]. They will call patient with appt time and date. Their phone number is [phone].

## 2020-06-24 ENCOUNTER — Other Ambulatory Visit: Payer: Self-pay

## 2020-06-24 ENCOUNTER — Other Ambulatory Visit: Payer: Self-pay | Admitting: Family Medicine

## 2020-06-24 ENCOUNTER — Ambulatory Visit: Payer: BC Managed Care – PPO | Admitting: Family Medicine

## 2020-06-24 ENCOUNTER — Ambulatory Visit: Payer: Self-pay

## 2020-06-24 VITALS — BP 120/70 | Ht 72.0 in | Wt 260.0 lb

## 2020-06-24 DIAGNOSIS — M19079 Primary osteoarthritis, unspecified ankle and foot: Secondary | ICD-10-CM | POA: Insufficient documentation

## 2020-06-24 DIAGNOSIS — N1832 Chronic kidney disease, stage 3b: Secondary | ICD-10-CM | POA: Diagnosis not present

## 2020-06-24 DIAGNOSIS — M10072 Idiopathic gout, left ankle and foot: Secondary | ICD-10-CM

## 2020-06-24 MED ORDER — ALLOPURINOL 100 MG PO TABS: 100.0000 mg | ORAL_TABLET | Freq: Every day | ORAL | 1 refills | Status: DC

## 2020-06-24 MED ORDER — PREDNISONE 20 MG PO TABS
ORAL_TABLET | ORAL | 0 refills | Status: DC
Start: 2020-06-24 — End: 2020-08-05

## 2020-06-24 MED FILL — predniSONE 20 MG TABS: 20 | 10 days supply | Qty: 18 | Fill #0

## 2020-06-24 MED FILL — ALLOPURINOL 100 MG TABS: 100 | 60 days supply | Qty: 60 | Fill #0

## 2020-06-24 NOTE — Assessment & Plan Note (Signed)
Lab work from last month is demonstrating chronic kidney disease.  He also has a history of anemia.  These may be contributing to his recent gouty attack. -Referral to nephrology.

## 2020-06-24 NOTE — Progress Notes (Signed)
Adrian Cole - 55 y.o. male MRN [id number]  Date of birth: 05/13/1966  SUBJECTIVE:  Including CC & ROS.  No chief complaint on file.   Adrian Cole is a 55 y.o. male that is presenting with acute worsening of his left great toe pain and having right heel pain.  Pain is been intermittent.  Has acutely worsened over the past few days.  He does get significant relief with the prednisone.  No recent injury or inciting event.   Review of Systems See HPI   HISTORY: Past Medical, Surgical, Social, and Family History Reviewed & Updated per EMR.   Pertinent Historical Findings include:  No past medical history on file.  No past surgical history on file.  No family history on file.  Social History   Socioeconomic History  . Marital status: Single    Spouse name: Not on file  . Number of children: Not on file  . Years of education: Not on file  . Highest education level: Not on file  Occupational History  . Not on file  Tobacco Use  . Smoking status: Never Smoker  . Smokeless tobacco: Never Used  Substance and Sexual Activity  . Alcohol use: No  . Drug use: No  . Sexual activity: Not on file  Other Topics Concern  . Not on file  Social History Narrative  . Not on file   Social Determinants of Health   Financial Resource Strain: Not on file  Food Insecurity: Not on file  Transportation Needs: Not on file  Physical Activity: Not on file  Stress: Not on file  Social Connections: Not on file  Intimate Partner Violence: Not on file     PHYSICAL EXAM:  VS: BP 120/70 (BP Location: Left Arm, Patient Position: Sitting, Cuff Size: Large)   Ht 6' (1.829 m)   Wt 260 lb (117.9 kg)   BMI 35.26 kg/m  Physical Exam Gen: NAD, alert, cooperative with exam, well-appearing MSK:  Left great toe: Hyperpigmentation and tenderness to palpation of the first MTP joint. Limited flexion extension. Swelling occurring over the first MTP joint. Neurovascularly intact  Limited  ultrasound: Left great toe, right foot:  Left great toe: The first MTP joint is again demonstrating synovitis with hyperemia and degenerative changes of the joint.  Right foot: There is increased hyperemia extending down into the subtalar joint. There is also hyperemia associated with the tarsal tunnel. No effusion noted in the ankle joint Plantar fascia measures to be normal with no hypoechoic changes.  Summary: Findings would suggest a gouty origin of the first MTP joint.  Right foot could be consistent with inflammatory changes as well.  Ultrasound and interpretation by Clare Gandy, MD    ASSESSMENT & PLAN:   Acute idiopathic gout involving toe of left foot Uric acid was found to be elevated on previous lab work.  Findings today again point out a gouty attack.  Does have CKD which could be contributing to his attacks. -Counseled home exercise therapy and supportive care. -Prednisone. -Allopurinol. -May need to increase his allopurinol.  Arthritis of subtalar joint Having pain around the heel.  Seems to be associated with inflammatory changes of the subtalar joint. -Counseled on home exercise therapy and supportive care. -Prednisone. -Could consider imaging.  Stage 3b chronic kidney disease (HCC) Lab work from last month is demonstrating chronic kidney disease.  He also has a history of anemia.  These may be contributing to his recent gouty attack. -Referral to nephrology.

## 2020-06-24 NOTE — Patient Instructions (Signed)
Good to see you Please start the allopurinol with the prednisone  Please try to be seen by the kidney doctor   Please send me a message in MyChart with any questions or updates.  Please see me back in 4-6 weeks.   --Dr. Jordan Likes

## 2020-06-24 NOTE — Assessment & Plan Note (Signed)
Uric acid was found to be elevated on previous lab work.  Findings today again point out a gouty attack.  Does have CKD which could be contributing to his attacks. -Counseled home exercise therapy and supportive care. -Prednisone. -Allopurinol. -May need to increase his allopurinol.

## 2020-06-24 NOTE — Assessment & Plan Note (Signed)
Having pain around the heel.  Seems to be associated with inflammatory changes of the subtalar joint. -Counseled on home exercise therapy and supportive care. -Prednisone. -Could consider imaging.

## 2020-08-05 ENCOUNTER — Other Ambulatory Visit: Payer: Self-pay | Admitting: Family Medicine

## 2020-08-05 ENCOUNTER — Ambulatory Visit (INDEPENDENT_AMBULATORY_CARE_PROVIDER_SITE_OTHER): Payer: BC Managed Care – PPO | Admitting: Family Medicine

## 2020-08-05 ENCOUNTER — Other Ambulatory Visit: Payer: Self-pay

## 2020-08-05 ENCOUNTER — Encounter: Payer: Self-pay | Admitting: Family Medicine

## 2020-08-05 VITALS — BP 120/80 | Ht 72.0 in | Wt 260.0 lb

## 2020-08-05 DIAGNOSIS — L600 Ingrowing nail: Secondary | ICD-10-CM

## 2020-08-05 DIAGNOSIS — M10071 Idiopathic gout, right ankle and foot: Secondary | ICD-10-CM

## 2020-08-05 DIAGNOSIS — M10072 Idiopathic gout, left ankle and foot: Secondary | ICD-10-CM | POA: Diagnosis not present

## 2020-08-05 MED ORDER — CEPHALEXIN 500 MG PO CAPS: 500.0000 mg | ORAL_CAPSULE | Freq: Two times a day (BID) | ORAL | 0 refills | Status: DC

## 2020-08-05 MED ORDER — PREDNISONE 20 MG PO TABS: ORAL_TABLET | ORAL | 0 refills | Status: DC

## 2020-08-05 NOTE — Assessment & Plan Note (Signed)
Having pain on the right foot that seems gout oriented as well. -Counseled on supportive care. -X-ray. -Uric acid

## 2020-08-05 NOTE — Progress Notes (Signed)
  Adrian Cole - 55 y.o. male MRN [id number]  Date of birth: 1966/01/08  SUBJECTIVE:  Including CC & ROS.  No chief complaint on file.   Adrian Cole is a 55 y.o. male that is presenting with right and left foot pain.  This is similar to previous pain that he experienced.  He is having pain over the first MTP joint on the left and right foot.  He is also having swelling and redness over the dorsum of the forefoot.  He is also having ingrown toenail of the left great toe.  No injury or inciting event.  He has a appointment with nephrology next month..   Review of Systems See HPI   HISTORY: Past Medical, Surgical, Social, and Family History Reviewed & Updated per EMR.   Pertinent Historical Findings include:  No past medical history on file.  No past surgical history on file.  No family history on file.  Social History   Socioeconomic History  . Marital status: Single    Spouse name: Not on file  . Number of children: Not on file  . Years of education: Not on file  . Highest education level: Not on file  Occupational History  . Not on file  Tobacco Use  . Smoking status: Never Smoker  . Smokeless tobacco: Never Used  Substance and Sexual Activity  . Alcohol use: No  . Drug use: No  . Sexual activity: Not on file  Other Topics Concern  . Not on file  Social History Narrative  . Not on file   Social Determinants of Health   Financial Resource Strain: Not on file  Food Insecurity: Not on file  Transportation Needs: Not on file  Physical Activity: Not on file  Stress: Not on file  Social Connections: Not on file  Intimate Partner Violence: Not on file     PHYSICAL EXAM:  VS: BP 120/80 (BP Location: Left Arm, Patient Position: Sitting, Cuff Size: Large)   Ht 6' (1.829 m)   Wt 260 lb (117.9 kg)   BMI 35.26 kg/m  Physical Exam Gen: NAD, alert, cooperative with exam, well-appearing MSK:  Right and left foot: Some warmth and swelling over the dorsum of the  forefoot. Tenderness to palpation of the first MTP joint bilaterally. Normal range of motion. Neurovascular intact     ASSESSMENT & PLAN:   Acute idiopathic gout involving toe of left foot Continues to have repeated bouts that seem more gout oriented.  May have some underlying arthritic changes as well. -Counseled on home exercise therapy and supportive care. -Green sport insoles. -Toe spacer. -Uric acid. -Prednisone  Ingrown toenail of left foot Seems to have an ingrown on the fibular side of the left great toe. -Counseled supportive care. -Keflex  Acute idiopathic gout of right foot Having pain on the right foot that seems gout oriented as well. -Counseled on supportive care. -X-ray. -Uric acid

## 2020-08-05 NOTE — Assessment & Plan Note (Signed)
Continues to have repeated bouts that seem more gout oriented.  May have some underlying arthritic changes as well. -Counseled on home exercise therapy and supportive care. -Green sport insoles. -Toe spacer. -Uric acid. -Prednisone

## 2020-08-05 NOTE — Assessment & Plan Note (Signed)
Seems to have an ingrown on the fibular side of the left great toe. -Counseled supportive care. -Keflex

## 2020-08-05 NOTE — Patient Instructions (Signed)
Good to see you  Please try the medicine  Please try to soak the feet  I will call with the results from today  Please send me a message in MyChart with any questions or updates.  Please see me back in 4 weeks.   --Dr. Jordan Likes   . Soak toe/foot in warm soapy water for 5-10 minutes, once daily for 5 days.

## 2020-09-01 ENCOUNTER — Ambulatory Visit (INDEPENDENT_AMBULATORY_CARE_PROVIDER_SITE_OTHER): Payer: BC Managed Care – PPO | Admitting: Family Medicine

## 2020-09-01 ENCOUNTER — Encounter: Payer: Self-pay | Admitting: Family Medicine

## 2020-09-01 ENCOUNTER — Other Ambulatory Visit: Payer: Self-pay

## 2020-09-01 DIAGNOSIS — N1832 Chronic kidney disease, stage 3b: Secondary | ICD-10-CM

## 2020-09-01 DIAGNOSIS — M11262 Other chondrocalcinosis, left knee: Secondary | ICD-10-CM

## 2020-09-01 DIAGNOSIS — L600 Ingrowing nail: Secondary | ICD-10-CM | POA: Diagnosis not present

## 2020-09-01 NOTE — Progress Notes (Signed)
  Adrian Cole - 55 y.o. male MRN [id number]  Date of birth: 11-22-1965  SUBJECTIVE:  Including CC & ROS.  No chief complaint on file.   Adrian Cole is a 55 y.o. male that is following up for his knee pain and he still pain.  He also has nephrology follow-up later this month.  His knee has been doing well.  His toe still bothers him intermittently.   Review of Systems See HPI   HISTORY: Past Medical, Surgical, Social, and Family History Reviewed & Updated per EMR.   Pertinent Historical Findings include:  No past medical history on file.  No past surgical history on file.  No family history on file.  Social History   Socioeconomic History  . Marital status: Single    Spouse name: Not on file  . Number of children: Not on file  . Years of education: Not on file  . Highest education level: Not on file  Occupational History  . Not on file  Tobacco Use  . Smoking status: Never Smoker  . Smokeless tobacco: Never Used  Substance and Sexual Activity  . Alcohol use: No  . Drug use: No  . Sexual activity: Not on file  Other Topics Concern  . Not on file  Social History Narrative  . Not on file   Social Determinants of Health   Financial Resource Strain: Not on file  Food Insecurity: Not on file  Transportation Needs: Not on file  Physical Activity: Not on file  Stress: Not on file  Social Connections: Not on file  Intimate Partner Violence: Not on file     PHYSICAL EXAM:  VS: BP 112/78 (BP Location: Left Arm, Patient Position: Sitting, Cuff Size: Large)   Ht 6' (1.829 m)   Wt 260 lb (117.9 kg)   BMI 35.26 kg/m  Physical Exam Gen: NAD, alert, cooperative with exam, well-appearing    ASSESSMENT & PLAN:   Ingrown toenail of left foot Has been doing better but intermittently causes him some pain. -Can remove when it becomes more problematic.  Pseudogout of left knee Has been doing well with no pain. -Could consider injection when needed.  Stage 3b  chronic kidney disease (HCC) Has an appointment to be established with nephrology later this month.

## 2020-09-02 NOTE — Assessment & Plan Note (Signed)
Has been doing better but intermittently causes him some pain. -Can remove when it becomes more problematic.

## 2020-09-02 NOTE — Assessment & Plan Note (Signed)
Has been doing well with no pain. -Could consider injection when needed.

## 2020-09-02 NOTE — Assessment & Plan Note (Signed)
Has an appointment to be established with nephrology later this month.

## 2020-10-15 ENCOUNTER — Other Ambulatory Visit: Payer: Self-pay

## 2020-10-15 ENCOUNTER — Emergency Department (HOSPITAL_BASED_OUTPATIENT_CLINIC_OR_DEPARTMENT_OTHER)
Admission: EM | Admit: 2020-10-15 | Discharge: 2020-10-15 | Disposition: A | Discharge status: Home / Self Care | Payer: BC Managed Care – PPO | Attending: Emergency Medicine | Admitting: Emergency Medicine

## 2020-10-15 ENCOUNTER — Encounter (HOSPITAL_BASED_OUTPATIENT_CLINIC_OR_DEPARTMENT_OTHER): Payer: Self-pay | Admitting: Emergency Medicine

## 2020-10-15 DIAGNOSIS — N1832 Chronic kidney disease, stage 3b: Secondary | ICD-10-CM | POA: Insufficient documentation

## 2020-10-15 DIAGNOSIS — M25561 Pain in right knee: Secondary | ICD-10-CM | POA: Diagnosis present

## 2020-10-15 MED ORDER — DEXAMETHASONE SODIUM PHOSPHATE 10 MG/ML IJ SOLN
10.0000 mg | Freq: Once | INTRAMUSCULAR | Status: AC
  Administered 2020-10-15: 10 mg via INTRAMUSCULAR
  Filled 2020-10-15: qty 1

## 2020-10-15 MED ORDER — IBUPROFEN 800 MG PO TABS: 800.0000 mg | ORAL_TABLET | Freq: Three times a day (TID) | ORAL | 0 refills | Status: DC

## 2020-10-15 MED ORDER — KETOROLAC TROMETHAMINE 60 MG/2ML IM SOLN
60.0000 mg | Freq: Once | INTRAMUSCULAR | Status: AC
  Administered 2020-10-15: 60 mg via INTRAMUSCULAR
  Filled 2020-10-15: qty 2

## 2020-10-15 NOTE — ED Notes (Signed)
ED Provider at bedside. 

## 2020-10-15 NOTE — ED Triage Notes (Signed)
Pt is c/o right knee pain that started yesterday  Minor swelling noted  Denies injury

## 2020-10-15 NOTE — ED Provider Notes (Signed)
[facility] EMERGENCY DEPARTMENT Provider Note   CSN: [id number] Arrival date & time: 10/15/20  0056     History Chief Complaint  Patient presents with  . Knee Pain    Adrian Cole is a 55 y.o. male.  Patient with previous history of gout and pseudogout presents to the ER for evaluation of right knee pain.  Patient reports that the pain began yesterday.  He denies any injury.  Pain is similar to when he was treated by Dr. Jordan Likes for pseudogout in his other knee last year.        History reviewed. No pertinent past medical history.  Patient Active Problem List   Diagnosis Date Noted  . Ingrown toenail of left foot 08/05/2020  . Acute idiopathic gout of right foot 08/05/2020  . Stage 3b chronic kidney disease (HCC) 06/24/2020  . Arthritis of subtalar joint 06/24/2020  . Acute idiopathic gout involving toe of left foot 05/11/2020  . Pseudogout of left knee 04/07/2020    Past Surgical History:  Procedure Laterality Date  . COLONOSCOPY         Family History  Problem Relation Age of Onset  . Hypertension Mother     Social History   Tobacco Use  . Smoking status: Never Smoker  . Smokeless tobacco: Never Used  Vaping Use  . Vaping Use: Never used  Substance Use Topics  . Alcohol use: No  . Drug use: No    Home Medications Prior to Admission medications   Medication Sig Start Date End Date Taking? Authorizing Provider  ibuprofen (ADVIL) 800 MG tablet Take 1 tablet (800 mg total) by mouth 3 (three) times daily. 10/15/20  Yes Gilda Crease, MD  allopurinol (ZYLOPRIM) 100 MG tablet TAKE 1 TABLET (100 MG TOTAL) BY MOUTH DAILY. 06/24/20 06/24/21  Myra Rude, MD  cephALEXin (KEFLEX) 500 MG capsule TAKE 1 CAPSULE BY MOUTH TWICE DAILY 08/05/20 08/05/21  Myra Rude, MD  furosemide (LASIX) 20 MG tablet Take 1 tablet (20 mg total) by mouth daily. 11/27/16   Jacalyn Lefevre, MD  lidocaine (LIDODERM) 5 % Place 1 patch onto the skin daily. Remove  & Discard patch within 12 hours or as directed by MD 02/18/20   Henderly, Britni A, PA-C    Allergies    Patient has no known allergies.  Review of Systems   Review of Systems  Constitutional: Negative for fever.  Musculoskeletal: Positive for arthralgias.  Skin: Negative.   Neurological: Negative.     Physical Exam Updated Vital Signs BP 126/73 (BP Location: Left Arm)   Pulse 84   Temp 99 F (37.2 C) (Oral)   Resp 18   Ht 6' (1.829 m)   Wt 122.5 kg   SpO2 95%   BMI 36.62 kg/m   Physical Exam Vitals and nursing note reviewed.  Constitutional:      Appearance: Normal appearance.  HENT:     Head: Normocephalic and atraumatic.  Cardiovascular:     Rate and Rhythm: Normal rate and regular rhythm.  Pulmonary:     Effort: Pulmonary effort is normal.     Breath sounds: Normal breath sounds.  Musculoskeletal:     Right knee: Swelling (slight, prepatellar) present. No effusion, erythema, ecchymosis or bony tenderness. Normal range of motion. Tenderness present.  Skin:    General: Skin is warm and dry.     Findings: No erythema or rash.  Neurological:     General: No focal deficit present.  Mental Status: He is alert.     Cranial Nerves: Cranial nerves are intact.     Sensory: Sensation is intact.     Motor: Motor function is intact.     Coordination: Coordination is intact.     ED Results / Procedures / Treatments   Labs (all labs ordered are listed, but only abnormal results are displayed) Labs Reviewed - No data to display  EKG None  Radiology No results found.  Procedures Procedures   Medications Ordered in ED Medications  dexamethasone (DECADRON) injection 10 mg (has no administration in time range)  ketorolac (TORADOL) injection 60 mg (has no administration in time range)    ED Course  I have reviewed the triage vital signs and the nursing notes.  Pertinent labs & imaging results that were available during my care of the patient were reviewed  by me and considered in my medical decision making (see chart for details).    MDM Rules/Calculators/A&P                          Patient presents with nontraumatic right knee pain.  Reviewing his records reveals he has been treated for gout in his feet and pseudogout in the left knee.  Patient reports that the pain in his right knee feels very similar to the pain that he had in the left knee last year.  Examination is reassuring.  He has full range of motion, no erythema or warmth.  At this time no findings that would suggest septic arthritis.  No trauma, does not require an x-ray at this time.  Will treat empirically, administer Decadron and Toradol here in the department, NSAIDs at home.  Follow-up with Dr. Jordan Likes.  Final Clinical Impression(s) / ED Diagnoses Final diagnoses:  Acute pain of right knee    Rx / DC Orders ED Discharge Orders         Ordered    ibuprofen (ADVIL) 800 MG tablet  3 times daily        10/15/20 0130           Gilda Crease, MD 10/15/20 0130

## 2020-10-21 ENCOUNTER — Ambulatory Visit: Payer: BC Managed Care – PPO | Admitting: Family Medicine

## 2020-10-21 ENCOUNTER — Encounter: Payer: Self-pay | Admitting: Family Medicine

## 2020-10-21 ENCOUNTER — Ambulatory Visit: Payer: Self-pay

## 2020-10-21 ENCOUNTER — Other Ambulatory Visit: Payer: Self-pay

## 2020-10-21 ENCOUNTER — Other Ambulatory Visit (HOSPITAL_BASED_OUTPATIENT_CLINIC_OR_DEPARTMENT_OTHER): Payer: Self-pay

## 2020-10-21 VITALS — BP 116/74 | Ht 72.0 in | Wt 270.0 lb

## 2020-10-21 DIAGNOSIS — M10061 Idiopathic gout, right knee: Secondary | ICD-10-CM

## 2020-10-21 DIAGNOSIS — M25561 Pain in right knee: Secondary | ICD-10-CM

## 2020-10-21 MED ORDER — PREDNISONE 20 MG PO TABS
ORAL_TABLET | ORAL | 0 refills | Status: DC
  Filled 2020-10-21: qty 18, 10d supply, fill #0

## 2020-10-21 MED ORDER — PREDNISONE 20 MG PO TABS: ORAL_TABLET | ORAL | 0 refills | Status: DC

## 2020-10-21 NOTE — Assessment & Plan Note (Signed)
Symptoms seem most likely to result of gout attack.  He did get improvement with the anti-inflammatories from the emergency department.  His uric acid was elevated at 11.  He is seeing nephrology for his chronic kidney disease. -Counseled on home exercise therapy and supportive care. -Prednisone.  Last A1c was 6.2.  Counseled on watching his blood sugar. -Counseled on allopurinol. -Could consider injection

## 2020-10-21 NOTE — Progress Notes (Signed)
  Adrian Cole - 55 y.o. male MRN [id number]  Date of birth: 1965/10/17  SUBJECTIVE:  Including CC & ROS.  No chief complaint on file.   Adrian Cole is a 55 y.o. male that is presenting with acute right knee pain.  Pain occurred with no inciting event.  He was seen in the emergency department had some improvement with medications.  Uric acid was found to be elevated at his nephrology appointment.   Review of Systems See HPI   HISTORY: Past Medical, Surgical, Social, and Family History Reviewed & Updated per EMR.   Pertinent Historical Findings include:  History reviewed. No pertinent past medical history.  Past Surgical History:  Procedure Laterality Date  . COLONOSCOPY      Family History  Problem Relation Age of Onset  . Hypertension Mother     Social History   Socioeconomic History  . Marital status: Single    Spouse name: Not on file  . Number of children: Not on file  . Years of education: Not on file  . Highest education level: Not on file  Occupational History  . Not on file  Tobacco Use  . Smoking status: Never Smoker  . Smokeless tobacco: Never Used  Vaping Use  . Vaping Use: Never used  Substance and Sexual Activity  . Alcohol use: No  . Drug use: No  . Sexual activity: Not on file  Other Topics Concern  . Not on file  Social History Narrative  . Not on file   Social Determinants of Health   Financial Resource Strain: Not on file  Food Insecurity: Not on file  Transportation Needs: Not on file  Physical Activity: Not on file  Stress: Not on file  Social Connections: Not on file  Intimate Partner Violence: Not on file     PHYSICAL EXAM:  VS: BP 116/74 (BP Location: Left Arm, Patient Position: Sitting, Cuff Size: Large)   Ht 6' (1.829 m)   Wt 270 lb (122.5 kg)   BMI 36.62 kg/m  Physical Exam Gen: NAD, alert, cooperative with exam, well-appearing MSK:  Right knee: No obvious effusion. Normal range of motion. Tenderness to palpation  of the medial compartment. Neurovascular intact  Limited ultrasound: Right knee:  Trace effusion. Normal-appearing quadricep patellar tendon. Degenerative changes of the medial meniscus. Significant hyperemia overlying the insertion at the Pes anserine and the proximal tibia  Summary: Hyperemia of the medial compartment soft tissue at the pes anserine to suggest a gouty origin.  Ultrasound and interpretation by Clare Gandy, MD    ASSESSMENT & PLAN:   Acute idiopathic gout of right knee Symptoms seem most likely to result of gout attack.  He did get improvement with the anti-inflammatories from the emergency department.  His uric acid was elevated at 11.  He is seeing nephrology for his chronic kidney disease. -Counseled on home exercise therapy and supportive care. -Prednisone.  Last A1c was 6.2.  Counseled on watching his blood sugar. -Counseled on allopurinol. -Could consider injection

## 2020-10-21 NOTE — Patient Instructions (Signed)
Good to see you Please try ice  Please take the prednisone  Please continue the allopurinol and take one pill in the morning and one pill in the afternoon.   Please send me a message in MyChart with any questions or updates.  Please see me back in 4 weeks.   --Dr. Jordan Likes

## 2020-11-19 ENCOUNTER — Other Ambulatory Visit: Payer: Self-pay

## 2020-11-19 ENCOUNTER — Encounter: Payer: Self-pay | Admitting: Family Medicine

## 2020-11-19 ENCOUNTER — Ambulatory Visit: Payer: BC Managed Care – PPO | Admitting: Family Medicine

## 2020-11-19 VITALS — BP 132/80 | Ht 72.0 in | Wt 270.0 lb

## 2020-11-19 DIAGNOSIS — M10061 Idiopathic gout, right knee: Secondary | ICD-10-CM | POA: Diagnosis not present

## 2020-11-19 DIAGNOSIS — G5762 Lesion of plantar nerve, left lower limb: Secondary | ICD-10-CM | POA: Diagnosis not present

## 2020-11-19 NOTE — Assessment & Plan Note (Signed)
Symptoms seem more consistent with a neuroma of the left foot as opposed to gout at this time.  Seems less likely for capsulitis. -Counseled on supportive care. -Provided gel padding

## 2020-11-19 NOTE — Assessment & Plan Note (Signed)
Doing well since finishing the prednisone.  -Counseled on home exercise therapy and supportive care. -Could consider testing uric acid.

## 2020-11-19 NOTE — Progress Notes (Signed)
  Adrian Cole - 55 y.o. male MRN [id number]  Date of birth: 30-Apr-1966  SUBJECTIVE:  Including CC & ROS.  No chief complaint on file.   Adrian Cole is a 55 y.o. male that is following up for his knee pain and presenting with acute left second toe pain.  His knee has been doing well since the prednisone.  The toe pain is more of a soreness between the second and third interdigital space.  Denies any injury or redness.    Review of Systems See HPI   HISTORY: Past Medical, Surgical, Social, and Family History Reviewed & Updated per EMR.   Pertinent Historical Findings include:  History reviewed. No pertinent past medical history.  Past Surgical History:  Procedure Laterality Date   COLONOSCOPY      Family History  Problem Relation Age of Onset   Hypertension Mother     Social History   Socioeconomic History   Marital status: Single    Spouse name: Not on file   Number of children: Not on file   Years of education: Not on file   Highest education level: Not on file  Occupational History   Not on file  Tobacco Use   Smoking status: Never   Smokeless tobacco: Never  Vaping Use   Vaping Use: Never used  Substance and Sexual Activity   Alcohol use: No   Drug use: No   Sexual activity: Not on file  Other Topics Concern   Not on file  Social History Narrative   Not on file   Social Determinants of Health   Financial Resource Strain: Not on file  Food Insecurity: Not on file  Transportation Needs: Not on file  Physical Activity: Not on file  Stress: Not on file  Social Connections: Not on file  Intimate Partner Violence: Not on file     PHYSICAL EXAM:  VS: BP 132/80 (BP Location: Left Arm, Patient Position: Sitting, Cuff Size: Large)   Ht 6' (1.829 m)   Wt 270 lb (122.5 kg)   BMI 36.62 kg/m  Physical Exam Gen: NAD, alert, cooperative with exam, well-appearing MSK:  Left foot: No redness or warmth over the second interdigital space. Normal range of  motion. No ulcer or defect. Neurovascularly intact   ASSESSMENT & PLAN:   Acute idiopathic gout of right knee Doing well since finishing the prednisone.  -Counseled on home exercise therapy and supportive care. -Could consider testing uric acid.  Morton's neuroma of left foot Symptoms seem more consistent with a neuroma of the left foot as opposed to gout at this time.  Seems less likely for capsulitis. -Counseled on supportive care. -Provided gel padding

## 2020-12-07 ENCOUNTER — Emergency Department (HOSPITAL_BASED_OUTPATIENT_CLINIC_OR_DEPARTMENT_OTHER)
Admission: EM | Admit: 2020-12-07 | Discharge: 2020-12-07 | Disposition: A | Discharge status: Home / Self Care | Payer: BC Managed Care – PPO | Attending: Emergency Medicine | Admitting: Emergency Medicine

## 2020-12-07 ENCOUNTER — Encounter (HOSPITAL_BASED_OUTPATIENT_CLINIC_OR_DEPARTMENT_OTHER): Payer: Self-pay

## 2020-12-07 ENCOUNTER — Other Ambulatory Visit: Payer: Self-pay

## 2020-12-07 ENCOUNTER — Other Ambulatory Visit (HOSPITAL_BASED_OUTPATIENT_CLINIC_OR_DEPARTMENT_OTHER): Payer: Self-pay

## 2020-12-07 DIAGNOSIS — K029 Dental caries, unspecified: Secondary | ICD-10-CM | POA: Insufficient documentation

## 2020-12-07 DIAGNOSIS — K0889 Other specified disorders of teeth and supporting structures: Secondary | ICD-10-CM

## 2020-12-07 MED ORDER — NAPROXEN 500 MG PO TABS
500.0000 mg | ORAL_TABLET | Freq: Two times a day (BID) | ORAL | 0 refills | Status: DC | PRN
  Filled 2020-12-07: qty 30, 15d supply, fill #0

## 2020-12-07 MED ORDER — PENICILLIN V POTASSIUM 500 MG PO TABS: 500.0000 mg | ORAL_TABLET | Freq: Four times a day (QID) | ORAL | 0 refills | Status: AC

## 2020-12-07 MED ORDER — PENICILLIN V POTASSIUM 500 MG PO TABS
500.0000 mg | ORAL_TABLET | Freq: Four times a day (QID) | ORAL | 0 refills | Status: DC
  Filled 2020-12-07: qty 28, 7d supply, fill #0

## 2020-12-07 MED ORDER — NAPROXEN 500 MG PO TABS: 500.0000 mg | ORAL_TABLET | Freq: Two times a day (BID) | ORAL | 0 refills | Status: DC | PRN

## 2020-12-07 MED ORDER — KETOROLAC TROMETHAMINE 60 MG/2ML IM SOLN
30.0000 mg | Freq: Once | INTRAMUSCULAR | Status: AC
  Administered 2020-12-07: 30 mg via INTRAMUSCULAR
  Filled 2020-12-07: qty 2

## 2020-12-07 NOTE — Discharge Instructions (Addendum)
Take the antibiotics and the prescribed antiinflamatory as needed for your dental pain. The most important next step is calling local dentist office to request a follow up appointment as soon as possible. See list attached. Come back for fever, uncontrolled pain, swelling, pus drainage or other new concerning symptom.

## 2020-12-07 NOTE — ED Triage Notes (Signed)
Tooth pain to back teeth on bottom right of jaw starting today, 10/10 pain

## 2020-12-07 NOTE — ED Provider Notes (Signed)
[facility] EMERGENCY DEPARTMENT Provider Note   CSN: [id number] Arrival date & time: 12/07/20  3235     History Chief Complaint  Patient presents with   Dental Pain    Adrian Cole is a 55 y.o. male.  Presents to ER with concern for dental pain.  Pain started today, right lower jaw.  Up to 10 out of 10 in severity.  Has not noted any swelling, no pus, no fever, no redness to his face.  Does not have a current dentist.  Lisabeth Devoid his old dentist retired.  HPI     History reviewed. No pertinent past medical history.  Patient Active Problem List   Diagnosis Date Noted   Morton's neuroma of left foot 11/19/2020   Acute idiopathic gout of right knee 10/21/2020   Ingrown toenail of left foot 08/05/2020   Acute idiopathic gout of right foot 08/05/2020   Stage 3b chronic kidney disease (HCC) 06/24/2020   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 Authorizing Provider  allopurinol (ZYLOPRIM) 100 MG tablet TAKE 1 TABLET (100 MG TOTAL) BY MOUTH DAILY. 06/24/20 06/24/21  Myra Rude, MD  cephALEXin (KEFLEX) 500 MG capsule TAKE 1 CAPSULE BY MOUTH TWICE DAILY 08/05/20 08/05/21  Myra Rude, MD  furosemide (LASIX) 20 MG tablet Take 1 tablet (20 mg total) by mouth daily. 11/27/16   Jacalyn Lefevre, MD  ibuprofen (ADVIL) 800 MG tablet Take 1 tablet (800 mg total) by mouth 3 (three) times daily. 10/15/20   Gilda Crease, MD  lidocaine (LIDODERM) 5  % Place 1 patch onto the skin daily. Remove & Discard patch within 12 hours or as directed by MD 02/18/20   Henderly, Britni A, PA-C  naproxen (NAPROSYN) 500 MG tablet Take 1 tablet (500 mg total) by mouth 2 (two) times daily as needed. 12/07/20   Milagros Loll, MD  penicillin v potassium (VEETID) 500 MG tablet Take 1 tablet (500 mg total) by mouth 4 (four) times daily for 7 days. 12/07/20 12/14/20  Milagros Loll, MD  predniSONE (DELTASONE) 20 MG tablet TAKE 3 TABLETS BY MOUTH ONCE DAILY FOR 3 DAYS, 2 TABLETS FOR 3 DAYS, 1 TABLET FOR 2 DAYS THEN 1/2 TABLET FOR 2 DAYS 10/21/20   Myra Rude, MD    Allergies    Patient has no known allergies.  Review of Systems   Review of Systems  HENT:  Positive for dental problem.   All other systems reviewed and are negative.  Physical Exam Updated Vital Signs BP (!) 141/81 (BP Location: Left Arm)   Pulse 89   Temp 98.4 F (36.9 C) (Oral)   Resp 16   Ht 6' (1.829 m)   Wt 122.5 kg   SpO2 100%   BMI 36.62 kg/m   Physical Exam Vitals and nursing note reviewed.  Constitutional:  Appearance: He is well-developed.  HENT:     Head: Normocephalic and atraumatic.     Comments: No facial swelling or facial erythema    Mouth/Throat:     Comments: There is tenderness over the right lower molar region, no surrounding erythema, pus, induration or abscess appreciated Eyes:     Conjunctiva/sclera: Conjunctivae normal.  Cardiovascular:     Heart sounds: No murmur heard.    Comments: Warm and well-perfused Pulmonary:     Effort: Pulmonary effort is normal. No respiratory distress.  Musculoskeletal:     Cervical back: Neck supple.  Skin:    General: Skin is warm and dry.  Neurological:     Mental Status: He is alert.  Psychiatric:        Mood and Affect: Mood normal.        Thought Content: Thought content normal.    ED Results / Procedures / Treatments   Labs (all labs ordered are listed, but only abnormal results are  displayed) Labs Reviewed - No data to display  EKG None  Radiology No results found.  Procedures Procedures   Medications Ordered in ED Medications  ketorolac (TORADOL) injection 30 mg (30 mg Intramuscular Given 12/07/20 0818)    ED Course  I have reviewed the triage vital signs and the nursing notes.  Pertinent labs & imaging results that were available during my care of the patient were reviewed by me and considered in my medical decision making (see chart for details).    MDM Rules/Calculators/A&P                          55 year old presented to ER with concern for dental pain.  Had some tenderness over his right lower molar, multiple dental caries appreciated in his mouth.  No abscess appreciated.  No redness or swelling to face.  Recommend NSAIDs, antibiotics, follow-up with dentistry outpatient.  Provided dental resources and discharged.  After the discussed management above, the patient was determined to be safe for discharge.  The patient was in agreement with this plan and all questions regarding their care were answered.  ED return precautions were discussed and the patient will return to the ED with any significant worsening of condition.  Final Clinical Impression(s) / ED Diagnoses Final diagnoses:  Pain, dental    Rx / DC Orders ED Discharge Orders          Ordered    naproxen (NAPROSYN) 500 MG tablet  2 times daily PRN,   Status:  Discontinued        12/07/20 0810    penicillin v potassium (VEETID) 500 MG tablet  4 times daily,   Status:  Discontinued        12/07/20 0810    naproxen (NAPROSYN) 500 MG tablet  2 times daily PRN        12/07/20 0836    penicillin v potassium (VEETID) 500 MG tablet  4 times daily        12/07/20 0836             Milagros Loll, MD 12/08/20 1140

## 2020-12-07 NOTE — ED Notes (Signed)
No obvious swelling or bleeding noted to teeth or gums.

## 2021-01-06 ENCOUNTER — Other Ambulatory Visit: Payer: Self-pay

## 2021-01-06 ENCOUNTER — Emergency Department (HOSPITAL_BASED_OUTPATIENT_CLINIC_OR_DEPARTMENT_OTHER): Payer: BC Managed Care – PPO

## 2021-01-06 ENCOUNTER — Emergency Department (HOSPITAL_BASED_OUTPATIENT_CLINIC_OR_DEPARTMENT_OTHER)
Admission: EM | Admit: 2021-01-06 | Discharge: 2021-01-06 | Disposition: A | Discharge status: Home / Self Care | Payer: BC Managed Care – PPO | Attending: Emergency Medicine | Admitting: Emergency Medicine

## 2021-01-06 ENCOUNTER — Encounter (HOSPITAL_BASED_OUTPATIENT_CLINIC_OR_DEPARTMENT_OTHER): Payer: Self-pay | Admitting: Emergency Medicine

## 2021-01-06 ENCOUNTER — Other Ambulatory Visit (HOSPITAL_BASED_OUTPATIENT_CLINIC_OR_DEPARTMENT_OTHER): Payer: Self-pay

## 2021-01-06 DIAGNOSIS — N1832 Chronic kidney disease, stage 3b: Secondary | ICD-10-CM | POA: Diagnosis not present

## 2021-01-06 DIAGNOSIS — M25562 Pain in left knee: Secondary | ICD-10-CM | POA: Diagnosis present

## 2021-01-06 DIAGNOSIS — Z79899 Other long term (current) drug therapy: Secondary | ICD-10-CM | POA: Insufficient documentation

## 2021-01-06 MED ORDER — METHYLPREDNISOLONE 4 MG PO TBPK
ORAL_TABLET | ORAL | 0 refills | Status: DC
  Filled 2021-01-06: qty 21, 6d supply, fill #0

## 2021-01-06 NOTE — Discharge Instructions (Addendum)
You were seen today for knee pain.  This may be related to your known gout or pseudogout.  Take the Medrol Dosepak as directed.  Ice and elevate.  Follow-up with sports medicine.

## 2021-01-06 NOTE — ED Notes (Signed)
Patient transported to X-ray 

## 2021-01-06 NOTE — ED Notes (Signed)
ED Provider at bedside. 

## 2021-01-06 NOTE — ED Triage Notes (Signed)
Pt is c/o left knee pain x 2 days  Denies injury

## 2021-01-06 NOTE — ED Provider Notes (Signed)
[facility] EMERGENCY DEPARTMENT Provider Note   CSN: [id number] Arrival date & time: 01/06/21  4709     History Chief Complaint  Patient presents with   Knee Pain    Adrian Cole is a 55 y.o. male.  HPI     This is a 55 year old male who presents with left knee pain.  Patient presents with atraumatic left knee pain.  Patient reports onset of symptoms 2 days ago.  He states pain is worse when he applies pressure.  Is not significantly changed with ambulation.  He has taken Advil with no relief.  He denies any history of gout or inflammatory arthritis but "somebody gave me some medicine to treat gout."  Denies any alcohol use or diet heavy and pork products.  He denies any fevers.  No known injury.  Currently he states his pain is well controlled.  Chart reviewed.  He has been seeing sports medicine several times and has been treated for both gout and pseudogout.  History reviewed. No pertinent past medical history.  Patient Active Problem List   Diagnosis Date Noted   Morton's neuroma of left foot 11/19/2020   Acute idiopathic gout of right knee 10/21/2020   Ingrown toenail of left foot 08/05/2020   Acute idiopathic gout of right foot 08/05/2020   Stage 3b chronic kidney disease (HCC) 06/24/2020   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 Authorizing Provider  allopurinol (ZYLOPRIM) 100 MG tablet TAKE 1 TABLET (100 MG TOTAL) BY MOUTH  DAILY. 06/24/20 06/24/21  Myra Rude, MD  cephALEXin (KEFLEX) 500 MG capsule TAKE 1 CAPSULE BY MOUTH TWICE DAILY 08/05/20 08/05/21  Myra Rude, MD  furosemide (LASIX) 20 MG tablet Take 1 tablet (20 mg total) by mouth daily. 11/27/16   Jacalyn Lefevre, MD  ibuprofen (ADVIL) 800 MG tablet Take 1 tablet (800 mg total) by mouth 3 (three) times daily. 10/15/20   Gilda Crease, MD  lidocaine (LIDODERM) 5 % Place 1 patch onto the skin daily. Remove & Discard patch within 12 hours or as directed by MD 02/18/20   Henderly, Britni A, PA-C  naproxen (NAPROSYN) 500 MG tablet Take 1 tablet (500 mg total) by mouth 2 (two) times daily as needed. 12/07/20   Milagros Loll, MD  predniSONE (DELTASONE) 20 MG tablet TAKE 3 TABLETS BY MOUTH ONCE DAILY FOR 3 DAYS, 2 TABLETS FOR 3 DAYS, 1 TABLET FOR 2 DAYS THEN 1/2 TABLET FOR 2 DAYS 10/21/20   Myra Rude, MD    Allergies    Patient has no known allergies.  Review of Systems   Review of Systems  Constitutional:  Negative for fever.  Musculoskeletal:        Left knee pain  All other systems reviewed and are negative.  Physical Exam Updated Vital Signs BP 118/76 (BP Location: Left Arm)  Pulse 87   Temp 98 F (36.7 C) (Oral)   Resp 16   Ht 1.829 m (6')   Wt 122.5 kg   SpO2 98%   BMI 36.62 kg/m   Physical Exam Vitals and nursing note reviewed.  Constitutional:      Appearance: He is well-developed. He is obese. He is not ill-appearing.  HENT:     Head: Normocephalic and atraumatic.     Mouth/Throat:     Mouth: Mucous membranes are moist.  Eyes:     Pupils: Pupils are equal, round, and reactive to light.  Cardiovascular:     Rate and Rhythm: Normal rate and regular rhythm.  Pulmonary:     Effort: Pulmonary effort is normal. No respiratory distress.  Abdominal:     Palpations: Abdomen is soft.  Musculoskeletal:     Cervical back: Neck supple.     Comments: Focused examination of the left knee with tenderness to palpation  over the medial joint line, no significant effusion, no overlying skin changes or erythema, normal range of motion, 2+ DP  Lymphadenopathy:     Cervical: No cervical adenopathy.  Skin:    General: Skin is warm and dry.  Neurological:     Mental Status: He is alert and oriented to person, place, and time.  Psychiatric:        Mood and Affect: Mood normal.    ED Results / Procedures / Treatments   Labs (all labs ordered are listed, but only abnormal results are displayed) Labs Reviewed - No data to display  EKG None  Radiology No results found.  Procedures Procedures   Medications Ordered in ED Medications - No data to display  ED Course  I have reviewed the triage vital signs and the nursing notes.  Pertinent labs & imaging results that were available during my care of the patient were reviewed by me and considered in my medical decision making (see chart for details).    MDM Rules/Calculators/A&P                           Patient presents with pain in the left knee.  Overall pain controlled on evaluation but states pain is worse with palpation.  History of presumed gout past.  He is not a good candidate for anti-inflammatories given last creatinine of 1.97.  X-rays obtained and showed no evidence of fracture.  Doubt septic joint.  We will treat conservatively with Medrol Dosepak.  After history, exam, and medical workup I feel the patient has been appropriately medically screened and is safe for discharge home. Pertinent diagnoses were discussed with the patient. Patient was given return precautions.  Final Clinical Impression(s) / ED Diagnoses Final diagnoses:  Acute pain of left knee    Rx / DC Orders ED Discharge Orders     None        Shon Baton, MD 01/06/21 [phone]

## 2021-01-14 ENCOUNTER — Other Ambulatory Visit: Payer: Self-pay

## 2021-01-14 ENCOUNTER — Ambulatory Visit: Payer: BC Managed Care – PPO | Admitting: Family Medicine

## 2021-01-14 ENCOUNTER — Ambulatory Visit: Payer: Self-pay

## 2021-01-14 VITALS — Ht 72.0 in | Wt 270.0 lb

## 2021-01-14 DIAGNOSIS — M1711 Unilateral primary osteoarthritis, right knee: Secondary | ICD-10-CM | POA: Diagnosis not present

## 2021-01-14 DIAGNOSIS — M25561 Pain in right knee: Secondary | ICD-10-CM

## 2021-01-14 NOTE — Progress Notes (Signed)
  Adrian Cole - 55 y.o. male MRN [id number]  Date of birth: Apr 03, 1966  SUBJECTIVE:  Including CC & ROS.  No chief complaint on file.   Adrian Cole is a 55 y.o. male that is presenting with right knee pain.  He was being treated for gout in the left knee.  His left knee is feeling better but the medial aspect of his right knee started giving him some problems.   Review of Systems See HPI   HISTORY: Past Medical, Surgical, Social, and Family History Reviewed & Updated per EMR.   Pertinent Historical Findings include:  No past medical history on file.  Past Surgical History:  Procedure Laterality Date   COLONOSCOPY      Family History  Problem Relation Age of Onset   Hypertension Mother     Social History   Socioeconomic History   Marital status: Single    Spouse name: Not on file   Number of children: Not on file   Years of education: Not on file   Highest education level: Not on file  Occupational History   Not on file  Tobacco Use   Smoking status: Never   Smokeless tobacco: Never  Vaping Use   Vaping Use: Never used  Substance and Sexual Activity   Alcohol use: No   Drug use: No   Sexual activity: Not on file  Other Topics Concern   Not on file  Social History Narrative   Not on file   Social Determinants of Health   Financial Resource Strain: Not on file  Food Insecurity: Not on file  Transportation Needs: Not on file  Physical Activity: Not on file  Stress: Not on file  Social Connections: Not on file  Intimate Partner Violence: Not on file     PHYSICAL EXAM:  VS: Ht 6' (1.829 m)   Wt 270 lb (122.5 kg)   BMI 36.62 kg/m  Physical Exam Gen: NAD, alert, cooperative with exam, well-appearing MSK:  Right knee: Tenderness palpation of the medial joint space. No redness or warmth. Normal range of motion. Neurovascularly intact  Limited ultrasound: Right knee:  No significant effusion. Normal-appearing quadricep and patellar  tendon. Degenerative changes observed in the medial compartment  Summary: Degenerative changes of the medial compartment  Ultrasound and interpretation by Clare Gandy, MD    ASSESSMENT & PLAN:   Primary osteoarthritis of right knee Symptoms more consistent with degenerative changes as opposed to gout in origin. -Counseled on home exercise therapy and supportive care. -Provided Pennsaid samples. -Provided Duexis samples. -Could consider injection or physical therapy.

## 2021-01-14 NOTE — Assessment & Plan Note (Signed)
Symptoms more consistent with degenerative changes as opposed to gout in origin. -Counseled on home exercise therapy and supportive care. -Provided Pennsaid samples. -Provided Duexis samples. -Could consider injection or physical therapy.

## 2021-01-14 NOTE — Patient Instructions (Signed)
Good to see you Please try ice  Please use the rub on medicine if the pain is mild  Please use the duexis if the pain is moderate or more severe   Please send me a message in MyChart with any questions or updates.  Please see me back in 4 weeks.   --Dr. Jordan Likes

## 2021-02-18 ENCOUNTER — Ambulatory Visit (INDEPENDENT_AMBULATORY_CARE_PROVIDER_SITE_OTHER): Payer: BC Managed Care – PPO | Admitting: Family Medicine

## 2021-02-18 ENCOUNTER — Encounter: Payer: Self-pay | Admitting: Family Medicine

## 2021-02-18 ENCOUNTER — Other Ambulatory Visit: Payer: Self-pay

## 2021-02-18 DIAGNOSIS — M1711 Unilateral primary osteoarthritis, right knee: Secondary | ICD-10-CM | POA: Diagnosis not present

## 2021-02-18 NOTE — Progress Notes (Signed)
  Adrian Cole - 55 y.o. male MRN [id number]  Date of birth: [birth year]  SUBJECTIVE:  Including CC & ROS.  No chief complaint on file.   Adrian Cole is a 55 y.o. male that is following up for his knee pain.  He did use the Duexis and feels much improved.  Denies any pain today.   Review of Systems See HPI   HISTORY: Past Medical, Surgical, Social, and Family History Reviewed & Updated per EMR.   Pertinent Historical Findings include:  History reviewed. No pertinent past medical history.  Past Surgical History:  Procedure Laterality Date   COLONOSCOPY      Family History  Problem Relation Age of Onset   Hypertension Mother     Social History   Socioeconomic History   Marital status: Single    Spouse name: Not on file   Number of children: Not on file   Years of education: Not on file   Highest education level: Not on file  Occupational History   Not on file  Tobacco Use   Smoking status: Never   Smokeless tobacco: Never  Vaping Use   Vaping Use: Never used  Substance and Sexual Activity   Alcohol use: No   Drug use: No   Sexual activity: Not on file  Other Topics Concern   Not on file  Social History Narrative   Not on file   Social Determinants of Health   Financial Resource Strain: Not on file  Food Insecurity: Not on file  Transportation Needs: Not on file  Physical Activity: Not on file  Stress: Not on file  Social Connections: Not on file  Intimate Partner Violence: Not on file     PHYSICAL EXAM:  VS: Ht 6' (1.829 m)   Wt 270 lb (122.5 kg)   BMI 36.62 kg/m  Physical Exam Gen: NAD, alert, cooperative with exam, well-appearing      ASSESSMENT & PLAN:   Primary osteoarthritis of right knee Doing well with the Duexis and home exercises. -Counseled on home exercise therapy and care. -Duexis samples provided. -Could consider injection if needed.

## 2021-02-18 NOTE — Assessment & Plan Note (Signed)
Doing well with the Duexis and home exercises. -Counseled on home exercise therapy and care. -Duexis samples provided. -Could consider injection if needed.

## 2021-04-24 ENCOUNTER — Emergency Department (HOSPITAL_BASED_OUTPATIENT_CLINIC_OR_DEPARTMENT_OTHER)
Admission: EM | Admit: 2021-04-24 | Discharge: 2021-04-24 | Disposition: A | Discharge status: Home / Self Care | Payer: BC Managed Care – PPO | Attending: Emergency Medicine | Admitting: Emergency Medicine

## 2021-04-24 ENCOUNTER — Other Ambulatory Visit: Payer: Self-pay

## 2021-04-24 ENCOUNTER — Encounter (HOSPITAL_BASED_OUTPATIENT_CLINIC_OR_DEPARTMENT_OTHER): Payer: Self-pay

## 2021-04-24 ENCOUNTER — Emergency Department (HOSPITAL_BASED_OUTPATIENT_CLINIC_OR_DEPARTMENT_OTHER): Payer: BC Managed Care – PPO

## 2021-04-24 DIAGNOSIS — N1832 Chronic kidney disease, stage 3b: Secondary | ICD-10-CM | POA: Diagnosis not present

## 2021-04-24 DIAGNOSIS — I129 Hypertensive chronic kidney disease with stage 1 through stage 4 chronic kidney disease, or unspecified chronic kidney disease: Secondary | ICD-10-CM | POA: Insufficient documentation

## 2021-04-24 DIAGNOSIS — Z79899 Other long term (current) drug therapy: Secondary | ICD-10-CM | POA: Insufficient documentation

## 2021-04-24 DIAGNOSIS — M10061 Idiopathic gout, right knee: Secondary | ICD-10-CM

## 2021-04-24 DIAGNOSIS — M25572 Pain in left ankle and joints of left foot: Secondary | ICD-10-CM | POA: Diagnosis not present

## 2021-04-24 DIAGNOSIS — M79672 Pain in left foot: Secondary | ICD-10-CM

## 2021-04-24 HISTORY — DX: Essential (primary) hypertension: I10

## 2021-04-24 HISTORY — DX: Gout, unspecified: M10.9

## 2021-04-24 MED ORDER — PREDNISONE 20 MG PO TABS: ORAL_TABLET | ORAL | 0 refills | Status: DC

## 2021-04-24 MED ORDER — NAPROXEN 375 MG PO TABS: 375.0000 mg | ORAL_TABLET | Freq: Two times a day (BID) | ORAL | 0 refills | Status: AC

## 2021-04-24 NOTE — ED Notes (Signed)
Portable Xray at bedside.

## 2021-04-24 NOTE — ED Triage Notes (Signed)
Pt c/o left ankle pain x 2 days. Hx of gout.

## 2021-04-24 NOTE — ED Provider Notes (Signed)
[facility] EMERGENCY DEPARTMENT Provider Note   CSN: [id number] Arrival date & time: 04/24/21  1660     History Chief Complaint  Patient presents with   Ankle Pain    Adrian Cole is a 55 y.o. male.  The history is provided by the patient.  Ankle Pain Location:  Ankle Time since incident:  2 days Ankle location:  L ankle Pain details:    Quality:  Aching   Severity:  Mild   Onset quality:  Gradual   Duration:  2 days   Timing:  Constant   Progression:  Unchanged Chronicity:  Recurrent Relieved by:  Nothing Worsened by:  Bearing weight Associated symptoms: swelling   Associated symptoms: no back pain, no decreased ROM, no fatigue, no fever, no itching, no muscle weakness, no neck pain, no numbness, no stiffness and no tingling   Risk factors comment:  Gout history     Past Medical History:  Diagnosis Date   Gout    Hypertension     Patient Active Problem List   Diagnosis Date Noted   Primary osteoarthritis of right knee 01/14/2021   Morton's neuroma of left foot 11/19/2020   Acute idiopathic gout of right knee 10/21/2020   Ingrown toenail of left foot 08/05/2020   Acute idiopathic gout of right foot 08/05/2020   Stage 3b chronic kidney disease (HCC) 06/24/2020   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 Authorizing Provider  allopurinol (ZYLOPRIM) 100 MG tablet TAKE 1 TABLET (100 MG TOTAL) BY MOUTH DAILY. 06/24/20 06/24/21   Myra Rude, MD  cephALEXin (KEFLEX) 500 MG capsule TAKE 1 CAPSULE BY MOUTH TWICE DAILY 08/05/20 08/05/21  Myra Rude, MD  furosemide (LASIX) 20 MG tablet Take 1 tablet (20 mg total) by mouth daily. 11/27/16   Jacalyn Lefevre, MD  ibuprofen (ADVIL) 800 MG tablet Take 1 tablet (800 mg total) by mouth 3 (three) times daily. 10/15/20   Gilda Crease, MD  lidocaine (LIDODERM) 5 % Place 1 patch onto the skin daily. Remove & Discard patch within 12 hours or as directed by MD 02/18/20   Henderly, Britni A, PA-C  methylPREDNISolone (MEDROL DOSEPAK) 4 MG TBPK tablet Take by mouth as directed on package 01/06/21   Horton, Mayer Masker, MD  naproxen (NAPROSYN) 375 MG tablet Take 1 tablet (375 mg total) by mouth 2 (two) times daily with a meal for 5 days. 04/24/21 04/29/21  Karmine Kauer, DO  predniSONE (DELTASONE) 20 MG tablet TAKE 3 TABLETS BY MOUTH ONCE DAILY FOR 3 DAYS, 2 TABLETS FOR 3 DAYS, 1 TABLET FOR 2 DAYS THEN 1/2 TABLET FOR 2 DAYS 04/24/21   Virgina Norfolk, DO    Allergies    Patient has no known allergies.  Review of Systems   Review of Systems  Constitutional:  Negative for chills, fatigue and fever.  Musculoskeletal:  Positive for arthralgias and gait problem. Negative for back pain, neck pain and stiffness.  Skin:  Negative for color change, itching and rash.  Neurological:  Negative for weakness and numbness.  All other systems reviewed and are negative.  Physical Exam Updated Vital Signs BP 138/84 (BP Location: Left Arm)   Pulse (!) 104   Temp 98.4 F (36.9 C) (Oral)   Resp 18   Ht 6' (1.829 m)   Wt 122.5 kg   SpO2 100%   BMI 36.62 kg/m   Physical Exam Constitutional:      General: He is not in acute distress.    Appearance: He is not ill-appearing.  Cardiovascular:     Pulses: Normal pulses.  Musculoskeletal:        General: Tenderness present. No swelling or deformity. Normal range of motion.     Cervical back: Normal range of motion.     Comments:  Tenderness to back of the left ankle/heel  Skin:    General: Skin is warm.     Capillary Refill: Capillary refill takes less than 2 seconds.  Neurological:     General: No focal deficit present.     Mental Status: He is alert.     Sensory: No sensory deficit.     Motor: No weakness.    ED Results / Procedures / Treatments   Labs (all labs ordered are listed, but only abnormal results are displayed) Labs Reviewed - No data to display  EKG None  Radiology DG Ankle Complete Left  Result Date: 04/24/2021 CLINICAL DATA:  LEFT ankle pain for 2 days.  History of gout. EXAM: LEFT ANKLE COMPLETE - 3+ VIEW COMPARISON:  None. FINDINGS: Osseous alignment is normal. Ankle mortise is symmetric. No acute-appearing osseous abnormality. No osteophytes or other signs of advanced DJD. No erosions or other radiographic signs of an inflammatory arthritis. Soft tissues about the LEFT ankle are unremarkable. Incidental note made of focal spurring at the dorsal margin of the posterior calcaneus and within the midfoot. IMPRESSION: 1. No acute findings. 2. No evidence of advanced DJD. No evidence of gouty arthritis. Electronically Signed   By: Franki Cabot M.D.   On: 04/24/2021 10:29    Procedures Procedures   Medications Ordered in ED Medications - No data to display  ED Course  I have reviewed the triage vital signs and the nursing notes.  Pertinent labs & imaging results that were available during my care of the patient were reviewed by me and considered in my medical decision making (see chart for details).    MDM Rules/Calculators/A&P                           Adrian Cole is here with pain in the left foot/ankle.  History of gout.  Exam is overall unremarkable.  No concern for septic joint.  There is no warmth or swelling.  He denies any injury.  X-ray showed no acute fracture or swelling.  May be some arthritic changes in the calcaneus area which is where he is most tender.  Will place in a  walking boot for several days to help with pain relief.  Will prescribe anti-inflammatories and have him follow-up with primary care doctor.  He has good pulses in his foot.  No signs of infection and overall suspect inflammatory process.  Discharged in good condition.  Understands return precautions.  This chart was dictated using voice recognition software.  Despite best efforts to  proofread,  errors can occur which can change the documentation meaning.    Final Clinical Impression(s) / ED Diagnoses Final diagnoses:  Left foot pain    Rx / DC Orders ED Discharge Orders          Ordered    predniSONE (DELTASONE) 20 MG tablet        04/24/21 0953    naproxen (NAPROSYN) 375 MG tablet  2 times daily with meals        04/24/21 [place], Corrigan Kretschmer, DO 04/24/21 1033

## 2021-04-24 NOTE — Discharge Instructions (Signed)
Overall suspect this is a inflammatory process.  X-ray shows no evidence of injury.  I have called you in an anti-inflammatory medicine to your pharmacy.  Please take medications as prescribed.  Please bear weight with walking boot as tolerated.  Follow-up with your primary care doctor or sports medicine doctor whose number is provided.

## 2021-04-24 NOTE — ED Notes (Signed)
Pt discharged to home. Discharge instructions have been discussed with patient and/or family members. Pt verbally acknowledges understanding d/c instructions, and endorses comprehension to checkout at registration before leaving.  °

## 2021-06-01 ENCOUNTER — Other Ambulatory Visit (HOSPITAL_BASED_OUTPATIENT_CLINIC_OR_DEPARTMENT_OTHER): Payer: Self-pay

## 2021-06-01 ENCOUNTER — Encounter (HOSPITAL_BASED_OUTPATIENT_CLINIC_OR_DEPARTMENT_OTHER): Payer: Self-pay | Admitting: *Deleted

## 2021-06-01 ENCOUNTER — Emergency Department (HOSPITAL_BASED_OUTPATIENT_CLINIC_OR_DEPARTMENT_OTHER)
Admission: EM | Admit: 2021-06-01 | Discharge: 2021-06-01 | Disposition: A | Discharge status: Home / Self Care | Payer: BC Managed Care – PPO | Attending: Emergency Medicine | Admitting: Emergency Medicine

## 2021-06-01 ENCOUNTER — Other Ambulatory Visit: Payer: Self-pay

## 2021-06-01 DIAGNOSIS — X58XXXA Exposure to other specified factors, initial encounter: Secondary | ICD-10-CM | POA: Diagnosis not present

## 2021-06-01 DIAGNOSIS — S56911A Strain of unspecified muscles, fascia and tendons at forearm level, right arm, initial encounter: Secondary | ICD-10-CM | POA: Diagnosis not present

## 2021-06-01 DIAGNOSIS — S59911A Unspecified injury of right forearm, initial encounter: Secondary | ICD-10-CM | POA: Diagnosis present

## 2021-06-01 MED ORDER — METHYLPREDNISOLONE 4 MG PO TBPK
ORAL_TABLET | ORAL | 0 refills | Status: DC
  Filled 2021-06-01: qty 21, 6d supply, fill #0

## 2021-06-01 MED ORDER — IBUPROFEN 800 MG PO TABS
800.0000 mg | ORAL_TABLET | Freq: Three times a day (TID) | ORAL | 0 refills | Status: DC
  Filled 2021-06-01: qty 21, 7d supply, fill #0

## 2021-06-01 NOTE — ED Provider Notes (Signed)
[facility] EMERGENCY DEPARTMENT Provider Note   CSN: [id number] Arrival date & time: 06/01/21  [id number]     History  Chief Complaint  Patient presents with   Arm Pain    Adrian Cole is Cole 56 y.o. male.  The history is provided by the patient.  Illness Location:  Right elbow Severity:  Mild Onset quality:  Gradual Duration:  2 days Timing:  Constant Progression:  Unchanged Chronicity:  New Context:  Right elbow pain, no injuries, history of gout, no fever Relieved by:  Nothing Worsened by:  Movement Associated symptoms: no fever and no rash       Home Medications Prior to Admission medications   Medication Sig Start Date End Date Taking? Authorizing Provider  allopurinol (ZYLOPRIM) 100 MG tablet TAKE 1 TABLET (100 MG TOTAL) BY MOUTH DAILY. 06/24/20 06/24/21  Adrian Ax, MD  cephALEXin (KEFLEX) 500 MG capsule TAKE 1 CAPSULE BY MOUTH TWICE DAILY 08/05/20 08/05/21  Adrian Ax, MD  furosemide (LASIX) 20 MG tablet Take 1 tablet (20 mg total) by mouth daily. 11/27/16   Adrian Pence, MD  ibuprofen (ADVIL) 800 MG tablet Take 1 tablet (800 mg total) by mouth 3 (three) times daily. 06/01/21   Adrian Mally, DO  lidocaine (LIDODERM) 5 % Place 1 patch onto the skin daily. Remove & Discard patch within 12 hours or as directed by MD 02/18/20   Henderly, Britni A, PA-C  methylPREDNISolone (MEDROL DOSEPAK) 4 MG TBPK tablet Take by mouth as directed on package 06/01/21   Adrian Jiles, DO  predniSONE (DELTASONE) 20 MG tablet TAKE 3 TABLETS BY MOUTH ONCE DAILY FOR 3 DAYS, 2 TABLETS FOR 3 DAYS, 1 TABLET FOR 2 DAYS THEN 1/2 TABLET FOR 2 DAYS 04/24/21   Adrian Sites, DO      Allergies    Patient has no known allergies.    Review of Systems   Review of Systems  Constitutional:  Negative for fever.  Musculoskeletal:  Positive for arthralgias. Negative for joint swelling.  Skin:  Negative for color change, rash and wound.  Neurological:  Negative for weakness and  numbness.   Physical Exam Updated Vital Signs BP 132/76 (BP Location: Left Arm)    Pulse 60    Temp 98.6 F (37 C) (Oral)    Resp 18    Ht 6' (1.829 m)    Wt 122.9 kg    SpO2 99%    BMI 36.75 kg/m  Physical Exam Constitutional:      General: He is not in acute distress.    Appearance: He is not ill-appearing.  Cardiovascular:     Pulses: Normal pulses.  Musculoskeletal:        General: Tenderness present.     Comments: Tenderness to right proximal forearm but good range of motion without much discomfort at the elbow  Skin:    General: Skin is warm.     Capillary Refill: Capillary refill takes less than 2 seconds.     Findings: No erythema or rash.  Neurological:     General: No focal deficit present.     Mental Status: He is alert.     Sensory: No sensory deficit.     Motor: No weakness.     Comments: 5+ out of 5 strength in upper extremities, normal sensation    ED Results / Procedures / Treatments   Labs (all labs ordered are listed, but only abnormal results are displayed) Labs Reviewed - No data to display  EKG  None  Radiology No results found.  Procedures Procedures    Medications Ordered in ED Medications - No data to display  ED Course/ Medical Decision Making/ Cole&P                           Medical Decision Making  Adrian Cole is here with right elbow pain for 2 days.  Unremarkable vitals.  No fever.  Comorbidities think about with this chief complaint are hypertension and history of gout.  Denies any trauma history.  No fevers or chills.  Right elbow is tender on exam but there is no major swelling or erythema or redness.  There is not much pain or range of motion of the right elbow.  Pain is mostly at the proximal forearm within the muscle belly and not necessarily on bony prominence.  Differential includes inflammatory process versus infectious versus strain/sprain.  No obvious deformity on exam.  No trauma history and have low suspicion for fracture.   Clinically does not appear to be infected.  No concern for septic joint or other infectious process..  Overall suspect that this is inflammatory/overuse process within the muscles of the proximal forearm.  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  We will write him for light duty at work.  Will prescribe anti-inflammatories including steroids and naproxen.  Recommend follow-up with primary care doctor/sports medicine.  Discharged in good condition.  This chart was dictated using voice recognition software.  Despite best efforts to proofread,  errors can occur which can change the documentation meaning.         Final Clinical Impression(s) / ED Diagnoses Final diagnoses:  Forearm strain, right, initial encounter    Rx / DC Orders ED Discharge Orders          Ordered    methylPREDNISolone (MEDROL DOSEPAK) 4 MG TBPK tablet        06/01/21 0730    ibuprofen (ADVIL) 800 MG tablet  3 times daily        06/01/21 0730              Adrian Sites, DO 06/01/21 0730

## 2021-06-01 NOTE — ED Notes (Signed)
Presents with rt elbow pain, ED MD at bedside. States has hx of Gout in his feet, has been prescribed medication. Denies any twisting motions, falls, injury, able to bend arm without difficulty, color wnl, has strong grip of rt hand. 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

## 2021-06-01 NOTE — ED Triage Notes (Signed)
Presents with Rt elbow pain, denies any injuries, falls, states pain began yesterday

## 2021-06-01 NOTE — Discharge Instructions (Addendum)
Take Medrol Dosepak as prescribed.  Recommend 800 mg ibuprofen every 8 hours for the next 5 days.  Recommend ice.  Do not do any vigorous activities with the right upper extremity.  Follow-up with your primary care doctor or sports medicine doctor whose number was provided.

## 2021-06-01 NOTE — ED Notes (Signed)
AVS and Rx x 2 reviewed with client, work note from ED MD also provided for light duty.

## 2021-06-22 ENCOUNTER — Other Ambulatory Visit (HOSPITAL_BASED_OUTPATIENT_CLINIC_OR_DEPARTMENT_OTHER): Payer: Self-pay

## 2021-06-22 ENCOUNTER — Ambulatory Visit (INDEPENDENT_AMBULATORY_CARE_PROVIDER_SITE_OTHER): Payer: BC Managed Care – PPO | Admitting: Family Medicine

## 2021-06-22 ENCOUNTER — Encounter: Payer: Self-pay | Admitting: Family Medicine

## 2021-06-22 VITALS — BP 118/82 | Ht 72.0 in | Wt 260.0 lb

## 2021-06-22 DIAGNOSIS — M1711 Unilateral primary osteoarthritis, right knee: Secondary | ICD-10-CM

## 2021-06-22 MED ORDER — PREDNISONE 20 MG PO TABS
ORAL_TABLET | ORAL | 0 refills | Status: DC
  Filled 2021-06-22: qty 18, 10d supply, fill #0

## 2021-06-22 MED ORDER — PREDNISONE 20 MG PO TABS: ORAL_TABLET | ORAL | 0 refills | Status: DC

## 2021-06-22 NOTE — Progress Notes (Signed)
°  Rudransh Annen - 56 y.o. male MRN [id number]  Date of birth: 1965/11/17  SUBJECTIVE:  Including CC & ROS.  No chief complaint on file.   Iker Hyland is a 56 y.o. male that is presenting with acute on chronic right shoulder pain.  The pain is occurring over the medial joint space.  It is similar to his previous pain.  No injury inciting event.    Review of Systems See HPI   HISTORY: Past Medical, Surgical, Social, and Family History Reviewed & Updated per EMR.   Pertinent Historical Findings include:  Past Medical History:  Diagnosis Date   Gout    Hypertension     Past Surgical History:  Procedure Laterality Date   COLONOSCOPY       PHYSICAL EXAM:  VS: BP 118/82 (BP Location: Left Arm, Patient Position: Sitting)    Ht 6' (1.829 m)    Wt 260 lb (117.9 kg)    BMI 35.26 kg/m  Physical Exam Gen: NAD, alert, cooperative with exam, well-appearing MSK:  Neurovascularly intact       ASSESSMENT & PLAN:   Primary osteoarthritis of right knee Acutely occurring.  Pain occurring more over the medial joint space to be consistent with degenerative changes.   -Counseled on home exercise therapy and supportive care. -Prednisone.  Would try to limit going forward. -Could consider physical therapy or further imaging.

## 2021-06-22 NOTE — Patient Instructions (Signed)
Good to see you Please use ice as needed  I will call with the results from today   Please send me a message in MyChart with any questions or updates.  Please see me back in 4-6 weeks or as needed if better.   --Dr. Raeford Razor

## 2021-06-23 NOTE — Assessment & Plan Note (Signed)
Acutely occurring.  Pain occurring more over the medial joint space to be consistent with degenerative changes.   -Counseled on home exercise therapy and supportive care. -Prednisone.  Would try to limit going forward. -Could consider physical therapy or further imaging.

## 2021-07-13 ENCOUNTER — Other Ambulatory Visit (HOSPITAL_COMMUNITY): Payer: Self-pay

## 2021-08-25 ENCOUNTER — Emergency Department (HOSPITAL_BASED_OUTPATIENT_CLINIC_OR_DEPARTMENT_OTHER)
Admission: EM | Admit: 2021-08-25 | Discharge: 2021-08-25 | Disposition: A | Discharge status: Home / Self Care | Payer: BC Managed Care – PPO | Attending: Emergency Medicine | Admitting: Emergency Medicine

## 2021-08-25 ENCOUNTER — Other Ambulatory Visit (HOSPITAL_BASED_OUTPATIENT_CLINIC_OR_DEPARTMENT_OTHER): Payer: Self-pay

## 2021-08-25 ENCOUNTER — Encounter (HOSPITAL_BASED_OUTPATIENT_CLINIC_OR_DEPARTMENT_OTHER): Payer: Self-pay | Admitting: Emergency Medicine

## 2021-08-25 ENCOUNTER — Emergency Department (HOSPITAL_BASED_OUTPATIENT_CLINIC_OR_DEPARTMENT_OTHER): Payer: BC Managed Care – PPO

## 2021-08-25 ENCOUNTER — Other Ambulatory Visit: Payer: Self-pay

## 2021-08-25 DIAGNOSIS — Z7984 Long term (current) use of oral hypoglycemic drugs: Secondary | ICD-10-CM | POA: Insufficient documentation

## 2021-08-25 DIAGNOSIS — I10 Essential (primary) hypertension: Secondary | ICD-10-CM | POA: Insufficient documentation

## 2021-08-25 DIAGNOSIS — E119 Type 2 diabetes mellitus without complications: Secondary | ICD-10-CM | POA: Diagnosis not present

## 2021-08-25 DIAGNOSIS — Z79899 Other long term (current) drug therapy: Secondary | ICD-10-CM | POA: Insufficient documentation

## 2021-08-25 DIAGNOSIS — M25522 Pain in left elbow: Secondary | ICD-10-CM | POA: Diagnosis present

## 2021-08-25 DIAGNOSIS — M109 Gout, unspecified: Secondary | ICD-10-CM | POA: Diagnosis not present

## 2021-08-25 MED ORDER — LIDOCAINE 5 % EX PTCH
1.0000 | MEDICATED_PATCH | CUTANEOUS | 0 refills | Status: AC
Start: 2021-08-25 — End: ?
  Filled 2021-08-25: qty 30, 30d supply, fill #0

## 2021-08-25 MED ORDER — OXYCODONE HCL 5 MG PO TABS
5.0000 mg | ORAL_TABLET | ORAL | 0 refills | Status: AC | PRN
  Filled 2021-08-25: qty 15, 3d supply, fill #0

## 2021-08-25 MED ORDER — COLCHICINE 0.6 MG PO TABS
ORAL_TABLET | ORAL | 0 refills | Status: DC
  Filled 2021-08-25: qty 3, 1d supply, fill #0

## 2021-08-25 MED ORDER — KETOROLAC TROMETHAMINE 60 MG/2ML IM SOLN
60.0000 mg | Freq: Once | INTRAMUSCULAR | Status: AC
  Administered 2021-08-25: 60 mg via INTRAMUSCULAR
  Filled 2021-08-25: qty 2

## 2021-08-25 MED ORDER — DEXAMETHASONE SODIUM PHOSPHATE 10 MG/ML IJ SOLN
10.0000 mg | Freq: Once | INTRAMUSCULAR | Status: AC
  Administered 2021-08-25: 10 mg via INTRAMUSCULAR
  Filled 2021-08-25: qty 1

## 2021-08-25 NOTE — ED Notes (Signed)
No s/s of reaction noted to IM injections. ?

## 2021-08-25 NOTE — ED Provider Notes (Signed)
?[facility] EMERGENC[facility] DEPARTMENT ?Provider Note ? ? ?CSN: [id number] ?Arrival date & time: 08/25/21  [id number] ? ?  ? ?History ? ?Chief Complaint  ?Patient presents with  ? Elbow Pain  ? ? ?Adrian Cole is a 56 y.o. male. ? ?HPI ? ?  ? ?56yo male with history of gout, hypertension, DM, presents with concern for right elbow pain.  ? ?Started yesterday afternoon and has progressively worsened.   ? ?Denies history of trauma or injury.  No fevers.  Denies history of IV drug use or other joint infections.  Has a history of gout, and this pain feels similar.  Had tried Tylenol for pain without relief.  The pain is severe, worsened by movements.  He has ? had gout in his elbow before, as well as his knees and foot.  No nausea, vomiting, dyspnea, chest pain. ? ? ?Past Medical History:  ?Diagnosis Date  ? Gout   ? Hypertension   ?  ? ?Home Medications ?Prior to Admission medications   ?Medication Sig Start Date End Date Taking? Authorizing Provider  ?allopurinol (ZYLOPRIM) 100 MG tablet Take by mouth. 06/01/21  Yes [provider]  ?colchicine 0.6 MG tablet Take 2 tablets (1.2mg ) followed by one tablet one hour later. 08/25/21  Yes Gareth Morgan, MD  ?oxyCODONE (ROXICODONE) 5 MG immediate release tablet Take 1 tablet (5 mg total) by mouth every 4 (four) hours as needed for severe pain. 08/25/21  Yes Gareth Morgan, MD  ?atorvastatin (LIPITOR) 80 MG tablet Take 80 mg by mouth at bedtime. 06/29/21   [provider]  ?glyBURIDE (DIABETA) 5 MG tablet Take 5 mg by mouth daily. 08/15/21   [provider]  ?hydrochlorothiazide (HYDRODIURIL) 25 MG tablet Take 25 mg by mouth daily. 08/15/21   [provider]  ?lidocaine (LIDODERM) 5 % Place 1 patch onto the skin daily. Remove & Discard patch within 12 hours or as directed by MD 08/25/21   Gareth Morgan, MD  ?lisinopril (ZESTRIL) 2.5 MG tablet Take 2.5 mg by mouth daily. 06/19/21   [provider]  ?metoprolol tartrate (LOPRESSOR) 50 MG  tablet Take 50 mg by mouth 2 (two) times daily. 05/06/21   [provider]  ?   ? ?Allergies    ?Patient has no known allergies.   ? ?Review of Systems   ?Review of Systems ?See above ? ?Physical Exam ?Updated Vital Signs ?BP 118/74 (BP Location: Right Arm)   Pulse 88   Temp 98.6 ?F (37 ?C) (Oral)   Resp 18   Ht 6' (1.829 m)   Wt 117 kg   SpO2 100%   BMI 34.99 kg/m?  ?Physical Exam ?Vitals and nursing note reviewed.  ?Constitutional:   ?   General: He is not in acute distress. ?   Appearance: Normal appearance. He is not ill-appearing, toxic-appearing or diaphoretic.  ?HENT:  ?   Head: Normocephalic.  ?Eyes:  ?   Conjunctiva/sclera: Conjunctivae normal.  ?Cardiovascular:  ?   Rate and Rhythm: Normal rate and regular rhythm.  ?   Pulses: Normal pulses.  ?Pulmonary:  ?   Effort: Pulmonary effort is normal. No respiratory distress.  ?Musculoskeletal:     ?   General: Tenderness (right elbow tenderness, pain with ROM, difficulty with full extension) present. No deformity or signs of injury.  ?   Cervical back: No rigidity.  ?Skin: ?   General: Skin is warm and dry.  ?   Coloration: Skin is not jaundiced or pale.  ?  Neurological:  ?   General: No focal deficit present.  ?   Mental Status: He is alert and oriented to person, place, and time.  ? ? ?ED Results / Procedures / Treatments   ?Labs ?(all labs ordered are listed, but only abnormal results are displayed) ?Labs Reviewed - No data to display ? ?EKG ?None ? ?Radiology ?DG Elbow Complete Right ? ?Result Date: 08/25/2021 ?CLINICAL DATA:  Pain since yesterday.  History of gout EXAM: RIGHT ELBOW - COMPLETE 3+ VIEW COMPARISON:  None. FINDINGS: Elbow joint effusion with up lifted ventral fat pad on the lateral view. No evidence of fracture, erosion, or soft tissue calcification. IMPRESSION: Elbow joint effusion without osseous abnormality. Electronically Signed   By: Jorje Guild M.D.   On: 08/25/2021 07:28   ? ?Procedures ?Procedures  ? ? ?Medications  Ordered in ED ?Medications  ?ketorolac (TORADOL) injection 60 mg (60 mg Intramuscular Given 08/25/21 0740)  ?dexamethasone (DECADRON) injection 10 mg (10 mg Intramuscular Given 08/25/21 0740)  ? ? ?ED Course/ Medical Decision Making/ A&P ?  ?                        ?Medical Decision Making ?Amount and/or Complexity of Data Reviewed ?Radiology: ordered. ? ?Risk ?Prescription drug management. ? ? ?56yo male with history of gout, hypertension, DM, presents with concern for right elbow pain.  XR evaluated by me shows no fracture or dislocation, does show elbow joint effusion.  He has normal pulses, no sign of acute arterial thrombus.  No swelling of the arm to suggest upper extremity DVT.  He is neurovascularly intact.  Overall, have low suspicion for septic arthritis given no fever, no risk factors, and history of known gout.  In the setting of his history of gout, will defer arthrocentesis and treat for suspected gout.  He does have mildly decreased GFR as of 1 year ago, and history of diabetes.  Given his levels and situation, do feel is reasonable to give a one-time dose of Toradol and Decadron. Discussed risks of hyperglycemia.  Discussed risks of narcotic medications, reviewed in [place] drug database, gave prescription for oxycodone.  Recommend Tylenol, lidocaine patch, in addition to the oxycodone for pain.  Also given a prescription for colchicine for 2 doses.  Recommend follow-up with his physicians. ? ? ? ? ? ? ? ?Final Clinical Impression(s) / ED Diagnoses ?Final diagnoses:  ?Acute gout of left elbow, unspecified cause  ?Left elbow pain  ? ? ?Rx / DC Orders ?ED Discharge Orders   ? ?      Ordered  ?  lidocaine (LIDODERM) 5 %  Every 24 hours       ? 08/25/21 0736  ?  colchicine 0.6 MG tablet       ? 08/25/21 0736  ?  oxyCODONE (ROXICODONE) 5 MG immediate release tablet  Every 4 hours PRN       ? 08/25/21 0736  ? ?  ?  ? ?  ? ? ?  ?Gareth Morgan, MD ?08/26/21 [phone] ? ?

## 2021-08-25 NOTE — ED Triage Notes (Signed)
Right elbow pain started yesterday afternoon and progressively gotten worse since. Has been up since 2 am due to pain. Denies injury or heavy lifting.  ?

## 2021-10-01 ENCOUNTER — Ambulatory Visit: Payer: Self-pay

## 2021-10-01 ENCOUNTER — Encounter: Payer: Self-pay | Admitting: Family Medicine

## 2021-10-01 ENCOUNTER — Ambulatory Visit (INDEPENDENT_AMBULATORY_CARE_PROVIDER_SITE_OTHER): Payer: BC Managed Care – PPO | Admitting: Family Medicine

## 2021-10-01 VITALS — BP 118/78 | Ht 72.0 in | Wt 258.0 lb

## 2021-10-01 DIAGNOSIS — M25421 Effusion, right elbow: Secondary | ICD-10-CM

## 2021-10-01 MED ORDER — TRIAMCINOLONE ACETONIDE 40 MG/ML IJ SUSP
40.0000 mg | Freq: Once | INTRAMUSCULAR | Status: AC
  Administered 2021-10-01: 40 mg via INTRA_ARTICULAR

## 2021-10-01 NOTE — Assessment & Plan Note (Signed)
Acutely occurring. Having worsening of his renal function. Most consistent with his gout.  ?-Counseled on home exercise therapy and supportive care. ?-Aspiration and injection. ?-Sent for synovial fluid analysis. ?-May need to send in more allopurinol and colchicine. ?

## 2021-10-01 NOTE — Progress Notes (Signed)
?  Adrian Cole - 56 y.o. male MRN [id number]  Date of birth: 03/26/1966 ? ?SUBJECTIVE:  Including CC & ROS.  ?No chief complaint on file. ? ? ?Adrian Cole is a 56 y.o. male that is presenting with acute right elbow pain.  He was seen a few months ago for similar pain.  He is having limited range of motion and severe pain at the elbow.  There is swelling and redness. ? ? ?Review of Systems ?See HPI  ? ?HISTORY: Past Medical, Surgical, Social, and Family History Reviewed & Updated per EMR.   ?Pertinent Historical Findings include: ? ?Past Medical History:  ?Diagnosis Date  ? Gout   ? Hypertension   ? ? ?Past Surgical History:  ?Procedure Laterality Date  ? COLONOSCOPY    ? ? ? ?PHYSICAL EXAM:  ?VS: BP 118/78 (BP Location: Left Arm, Patient Position: Sitting)   Ht 6' (1.829 m)   Wt 258 lb (117 kg)   BMI 34.99 kg/m?  ?Physical Exam ?Gen: NAD, alert, cooperative with exam, well-appearing ?MSK:  ?Neurovascularly intact   ? ?Limited ultrasound: Right elbow: ? ?Moderate effusion of the joint. ?Hyperemia associated around the joint space. ?Normal-appearing radial head ? ?Summary: Effusion and synovitis of the right elbow ? ?Ultrasound and interpretation by Clare Gandy, MD ? ? ?Aspiration/Injection Procedure Note ?Adrian Cole ?09-06-1965 ? ?Procedure: Aspiration and Injection ?Indications: Right elbow pain ? ?Procedure Details ?Consent: Risks of procedure as well as the alternatives and risks of each were explained to the (patient/caregiver).  Consent for procedure obtained. ?Time Out: Verified patient identification, verified procedure, site/side was marked, verified correct patient position, special equipment/implants available, medications/allergies/relevent history reviewed, required imaging and test results available.  Performed.  The area was cleaned with iodine and alcohol swabs.   ? ?The right lateral elbow joint was injected using 3 cc of 1% lidocaine on a 25-gauge 1-1/2 inch needle.  An 18-gauge 1-1/2  inch needle was used to achieve aspiration.  The syringe was switched to mixture containing 1 cc's of 40 mg Kenalog and 2 cc's of 0.25% bupivacaine was injected.  Ultrasound was used. Images were obtained in long views showing the injection.   ? ?Amount of Fluid Aspirated: 2mL ?Character of Fluid: purulent appearing ?Fluid was sent QPR:FFMB count and crystal identification ? ?A sterile dressing was applied. ? ?Patient did tolerate procedure well. ? ? ? ?ASSESSMENT & PLAN:  ? ?Effusion of right elbow ?Acutely occurring. Having worsening of his renal function. Most consistent with his gout.  ?-Counseled on home exercise therapy and supportive care. ?-Aspiration and injection. ?-Sent for synovial fluid analysis. ?-May need to send in more allopurinol and colchicine. ? ? ? ? ?

## 2021-10-01 NOTE — Patient Instructions (Signed)
Good to see you ?Please use ice as needed   ?Please send me a message in MyChart with any questions or updates.  ?Please see me back in 2 weeks.  ? ?--Dr. Jordan Likes ? ?

## 2021-10-02 LAB — SYNOVIAL FLUID ANALYSIS, COMPLETE
Basophils, %: 0 %
Eosinophils-Synovial: 0 % (ref 0–2)
Lymphocytes-Synovial Fld: 17 % (ref 0–74)
Monocyte/Macrophage: 8 % (ref 0–69)
Neutrophil, Synovial: 75 % — ABNORMAL HIGH (ref 0–24)
Synoviocytes, %: 0 % (ref 0–15)
WBC, Synovial: 38545 cells/uL — ABNORMAL HIGH (ref <150)

## 2021-10-04 ENCOUNTER — Telehealth: Payer: Self-pay | Admitting: Family Medicine

## 2021-10-04 NOTE — Telephone Encounter (Signed)
Left VM for patient. If he calls back please have him speak with a nurse/CMA and inform that his labs show no signs of infection.  ? ?If any questions then please take the best time and phone number to call and I will try to call him back.  ? ?Myra Rude, MD ?[facility] ?10/04/2021, 1:26 PM ? ? ?

## 2021-10-14 ENCOUNTER — Ambulatory Visit (INDEPENDENT_AMBULATORY_CARE_PROVIDER_SITE_OTHER): Payer: BC Managed Care – PPO | Admitting: Family Medicine

## 2021-10-14 DIAGNOSIS — M25421 Effusion, right elbow: Secondary | ICD-10-CM

## 2021-10-14 NOTE — Progress Notes (Signed)
  Adrian Cole - 56 y.o. male MRN [id number]  Date of birth: [birth year]  SUBJECTIVE:  Including CC & ROS.  No chief complaint on file.   Adrian Cole is a 56 y.o. male that is following up for his right elbow pain.  His aspiration was not demonstrating crystals or infection.  He has got significant improvement since the aspiration and injection.   Review of Systems See HPI   HISTORY: Past Medical, Surgical, Social, and Family History Reviewed & Updated per EMR.   Pertinent Historical Findings include:  Past Medical History:  Diagnosis Date   Gout    Hypertension     Past Surgical History:  Procedure Laterality Date   COLONOSCOPY       PHYSICAL EXAM:  VS: BP 110/60   Ht 6' (1.829 m)   Wt 250 lb (113.4 kg)   BMI 33.91 kg/m  Physical Exam Gen: NAD, alert, cooperative with exam, well-appearing MSK:  Neurovascularly intact       ASSESSMENT & PLAN:   Effusion of right elbow Improved since aspiration and injection.  Symptoms seem still most consistent with a gouty origin even though the synovial fluid analysis did not reveal crystals. -Counseled on home exercise therapy and supportive care. -Counseled on allopurinol.

## 2021-10-14 NOTE — Assessment & Plan Note (Addendum)
Improved since aspiration and injection.  Symptoms seem still most consistent with a gouty origin even though the synovial fluid analysis did not reveal crystals. -Counseled on home exercise therapy and supportive care. -Counseled on allopurinol.

## 2021-10-16 ENCOUNTER — Emergency Department (HOSPITAL_BASED_OUTPATIENT_CLINIC_OR_DEPARTMENT_OTHER)
Admission: EM | Admit: 2021-10-16 | Discharge: 2021-10-16 | Disposition: A | Discharge status: Home / Self Care | Payer: BC Managed Care – PPO | Attending: Emergency Medicine | Admitting: Emergency Medicine

## 2021-10-16 ENCOUNTER — Emergency Department (HOSPITAL_BASED_OUTPATIENT_CLINIC_OR_DEPARTMENT_OTHER): Payer: BC Managed Care – PPO

## 2021-10-16 ENCOUNTER — Encounter (HOSPITAL_BASED_OUTPATIENT_CLINIC_OR_DEPARTMENT_OTHER): Payer: Self-pay | Admitting: Emergency Medicine

## 2021-10-16 ENCOUNTER — Other Ambulatory Visit: Payer: Self-pay

## 2021-10-16 DIAGNOSIS — S93601A Unspecified sprain of right foot, initial encounter: Secondary | ICD-10-CM

## 2021-10-16 DIAGNOSIS — X58XXXA Exposure to other specified factors, initial encounter: Secondary | ICD-10-CM | POA: Diagnosis not present

## 2021-10-16 DIAGNOSIS — S99921A Unspecified injury of right foot, initial encounter: Secondary | ICD-10-CM | POA: Diagnosis present

## 2021-10-16 MED ORDER — IBUPROFEN 800 MG PO TABS
800.0000 mg | ORAL_TABLET | Freq: Once | ORAL | Status: AC
  Administered 2021-10-16: 800 mg via ORAL
  Filled 2021-10-16: qty 1

## 2021-10-16 MED ORDER — ACETAMINOPHEN 500 MG PO TABS
1000.0000 mg | ORAL_TABLET | Freq: Once | ORAL | Status: AC
  Administered 2021-10-16: 1000 mg via ORAL
  Filled 2021-10-16: qty 2

## 2021-10-16 NOTE — ED Notes (Signed)
Pt refused crutches. EDP updated

## 2021-10-16 NOTE — ED Notes (Signed)
ED Provider at bedside. 

## 2021-10-16 NOTE — Discharge Instructions (Signed)
Take 4 over the counter ibuprofen tablets 3 times a day or 2 over-the-counter naproxen tablets twice a day for pain. Also take tylenol 1000mg(2 extra strength) four times a day.    

## 2021-10-16 NOTE — ED Provider Notes (Signed)
[facility] EMERGENCY DEPARTMENT Provider Note   CSN: [id number] Arrival date & time: 10/16/21  0741     History  Chief Complaint  Patient presents with   Foot Pain    Adrian Cole is a 56 y.o. male.  56 yo M with a chief complaint of right foot pain.  This been going on for couple days.  Denies obvious injury to the area.  Pain mostly on the lateral aspect of the foot.  Pain with palpation pain with bearing weight.   Foot Pain      Home Medications Prior to Admission medications   Medication Sig Start Date End Date Taking? Authorizing Provider  allopurinol (ZYLOPRIM) 100 MG tablet Take by mouth. 06/01/21   [provider]  atorvastatin (LIPITOR) 80 MG tablet Take 80 mg by mouth at bedtime. 06/29/21   [provider]  colchicine 0.6 MG tablet Take 2 tablets (1.2mg ) followed by one tablet one hour later. 08/25/21   Alvira Monday, MD  glyBURIDE (DIABETA) 5 MG tablet Take 5 mg by mouth daily. 08/15/21   [provider]  hydrochlorothiazide (HYDRODIURIL) 25 MG tablet Take 25 mg by mouth daily. 08/15/21   [provider]  lidocaine (LIDODERM) 5 % Place 1 patch onto the skin daily. Remove & Discard patch within 12 hours or as directed by MD 08/25/21   Alvira Monday, MD  lisinopril (ZESTRIL) 2.5 MG tablet Take 2.5 mg by mouth daily. 06/19/21   [provider]  metoprolol tartrate (LOPRESSOR) 50 MG tablet Take 50 mg by mouth 2 (two) times daily. 05/06/21   [provider]  oxyCODONE (ROXICODONE) 5 MG immediate release tablet Take 1 tablet (5 mg total) by mouth every 4 (four) hours as needed for severe pain. 08/25/21   Alvira Monday, MD      Allergies    Patient has no known allergies.    Review of Systems   Review of Systems  Physical Exam Updated Vital Signs BP (!) 141/92   Pulse 96   Temp 98.6 F (37 C) (Oral)   Resp 16   Ht 6' (1.829 m)   Wt 113.4 kg   SpO2 100%   BMI 33.91 kg/m  Physical Exam Vitals  and nursing note reviewed.  Constitutional:      Appearance: He is well-developed.  HENT:     Head: Normocephalic and atraumatic.  Eyes:     Pupils: Pupils are equal, round, and reactive to light.  Neck:     Vascular: No JVD.  Cardiovascular:     Rate and Rhythm: Normal rate and regular rhythm.     Heart sounds: No murmur heard.   No friction rub. No gallop.  Pulmonary:     Effort: No respiratory distress.     Breath sounds: No wheezing.  Abdominal:     General: There is no distension.     Tenderness: There is no abdominal tenderness. There is no guarding or rebound.  Musculoskeletal:        General: Tenderness present. Normal range of motion.     Cervical back: Normal range of motion and neck supple.     Comments: Pain and bruising to the base of the fifth metatarsal.  No obvious pain or edema to the ankle.  Pulse motor and sensation intact to the foot.  Skin:    Coloration: Skin is not pale.     Findings: No rash.  Neurological:     Mental Status: He is alert and oriented to  person, place, and time.  Psychiatric:        Behavior: Behavior normal.    ED Results / Procedures / Treatments   Labs (all labs ordered are listed, but only abnormal results are displayed) Labs Reviewed - No data to display  EKG None  Radiology DG Foot Complete Right  Result Date: 10/16/2021 CLINICAL DATA:  Atraumatic right foot pain for 2 days EXAM: RIGHT FOOT COMPLETE - 3+ VIEW COMPARISON:  07/16/2018 FINDINGS: There is no evidence of fracture or dislocation. Heel spur. No erosive changes or focal joint space narrowing. IMPRESSION: No acute finding or change from 2020. Electronically Signed   By: Tiburcio Pea M.D.   On: 10/16/2021 08:22    Procedures Procedures    Medications Ordered in ED Medications  acetaminophen (TYLENOL) tablet 1,000 mg (1,000 mg Oral Given 10/16/21 0819)  ibuprofen (ADVIL) tablet 800 mg (800 mg Oral Given 10/16/21 4742)    ED Course/ Medical Decision Making/  A&P                           Medical Decision Making Amount and/or Complexity of Data Reviewed Radiology: ordered.  Risk OTC drugs. Prescription drug management.   56 yo M with a chief complaints of right lateral foot pain.  Going on for about 48 hours.  Reportedly atraumatic.  Signs of possible minor trauma on exam.  Will obtain a plain film to assess for fracture.  Plain film of the foot independently interpreted by me without fracture.  Will place in an ASO crutches PCP follow-up.  8:36 AM:  I have discussed the diagnosis/risks/treatment options with the patient.  Evaluation and diagnostic testing in the emergency department does not suggest an emergent condition requiring admission or immediate intervention beyond what has been performed at this time.  They will follow up with  PCP. We also discussed returning to the ED immediately if new or worsening sx occur. We discussed the sx which are most concerning (e.g., sudden worsening pain, fever, inability to tolerate by mouth) that necessitate immediate return. Medications administered to the patient during their visit and any new prescriptions provided to the patient are listed below.  Medications given during this visit Medications  acetaminophen (TYLENOL) tablet 1,000 mg (1,000 mg Oral Given 10/16/21 0819)  ibuprofen (ADVIL) tablet 800 mg (800 mg Oral Given 10/16/21 5956)     The patient appears reasonably screen and/or stabilized for discharge and I doubt any other medical condition or other [facility] requiring further screening, evaluation, or treatment in the ED at this time prior to discharge.          Final Clinical Impression(s) / ED Diagnoses Final diagnoses:  Sprain of right foot, initial encounter    Rx / DC Orders ED Discharge Orders     None         Melene Plan, DO 10/16/21 [phone]

## 2021-10-16 NOTE — ED Notes (Signed)
Pt discharged to home. Discharge instructions have been discussed with patient and/or family members. Pt verbally acknowledges understanding d/c instructions, and endorses comprehension to checkout at registration before leaving.  °

## 2021-10-16 NOTE — ED Triage Notes (Addendum)
Pt arrives slow, steady gait, with limp c/o right lateral foot pain x 2 days, deneis injury

## 2021-10-17 ENCOUNTER — Encounter (HOSPITAL_BASED_OUTPATIENT_CLINIC_OR_DEPARTMENT_OTHER): Payer: Self-pay | Admitting: Emergency Medicine

## 2021-10-17 ENCOUNTER — Emergency Department (HOSPITAL_BASED_OUTPATIENT_CLINIC_OR_DEPARTMENT_OTHER)
Admission: EM | Admit: 2021-10-17 | Discharge: 2021-10-17 | Disposition: A | Discharge status: Home / Self Care | Payer: BC Managed Care – PPO | Attending: Emergency Medicine | Admitting: Emergency Medicine

## 2021-10-17 ENCOUNTER — Emergency Department (HOSPITAL_BASED_OUTPATIENT_CLINIC_OR_DEPARTMENT_OTHER): Payer: BC Managed Care – PPO

## 2021-10-17 ENCOUNTER — Other Ambulatory Visit: Payer: Self-pay

## 2021-10-17 DIAGNOSIS — M25561 Pain in right knee: Secondary | ICD-10-CM | POA: Diagnosis present

## 2021-10-17 DIAGNOSIS — I10 Essential (primary) hypertension: Secondary | ICD-10-CM | POA: Diagnosis not present

## 2021-10-17 DIAGNOSIS — M1711 Unilateral primary osteoarthritis, right knee: Secondary | ICD-10-CM | POA: Insufficient documentation

## 2021-10-17 DIAGNOSIS — Z79899 Other long term (current) drug therapy: Secondary | ICD-10-CM | POA: Diagnosis not present

## 2021-10-17 MED ORDER — ACETAMINOPHEN 325 MG PO TABS: 650.0000 mg | ORAL_TABLET | Freq: Four times a day (QID) | ORAL | 0 refills | Status: DC | PRN

## 2021-10-17 MED ORDER — IBUPROFEN 600 MG PO TABS: 600.0000 mg | ORAL_TABLET | Freq: Four times a day (QID) | ORAL | 0 refills | Status: DC | PRN

## 2021-10-17 MED ORDER — KETOROLAC TROMETHAMINE 60 MG/2ML IM SOLN
60.0000 mg | Freq: Once | INTRAMUSCULAR | Status: AC
Start: 2021-10-17 — End: 2021-10-17
  Administered 2021-10-17: 60 mg via INTRAMUSCULAR
  Filled 2021-10-17: qty 2

## 2021-10-17 NOTE — ED Triage Notes (Signed)
Pt arrives pov, slow gait c/o right knee pain starting yesterday, denies injury

## 2021-10-17 NOTE — ED Notes (Signed)
ED Provider at bedside. 

## 2021-10-17 NOTE — Discharge Instructions (Addendum)
Your x-ray did not show evidence of fracture.  It was a pleasure caring for you today in the emergency department.  Please return to the emergency department for any worsening or worrisome symptoms.

## 2021-10-17 NOTE — ED Provider Notes (Signed)
[facility] EMERGENCY DEPARTMENT Provider Note   CSN: [id number] Arrival date & time: 10/17/21  4098     History  Chief Complaint  Patient presents with   Knee Pain    Adrian Cole is a 56 y.o. male.  Patient as above with significant medical history as below, including gout, hypertension who presents to the ED with complaint of right knee pain.  He was seen yesterday for right foot pain which patient reports has resolved.  Pain mostly to the anterior and inferior aspect of his right knee.  Denies injury.  No fevers or joint swelling.  No numbness or tingling.  Feels similar to prior episodes of gout.  He was seen recently by Dr Jordan Likes and had an elbow aspiration. He is on colchicine and allopurinol. No IVDU     Past Medical History:  Diagnosis Date   Gout    Hypertension     Past Surgical History:  Procedure Laterality Date   COLONOSCOPY       The history is provided by the patient. No language interpreter was used.  Knee Pain Associated symptoms: no fever       Home Medications Prior to Admission medications   Medication Sig Start Date End Date Taking? Authorizing Provider  acetaminophen (TYLENOL) 325 MG tablet Take 2 tablets (650 mg total) by mouth every 6 (six) hours as needed. 10/17/21  Yes Tanda Rockers A, DO  ibuprofen (ADVIL) 600 MG tablet Take 1 tablet (600 mg total) by mouth every 6 (six) hours as needed. 10/17/21  Yes Tanda Rockers A, DO  allopurinol (ZYLOPRIM) 100 MG tablet Take by mouth. 06/01/21   [provider]  atorvastatin (LIPITOR) 80 MG tablet Take 80 mg by mouth at bedtime. 06/29/21   [provider]  colchicine 0.6 MG tablet Take 2 tablets (1.2mg ) followed by one tablet one hour later. 08/25/21   Alvira Monday, MD  glyBURIDE (DIABETA) 5 MG tablet Take 5 mg by mouth daily. 08/15/21   [provider]  hydrochlorothiazide (HYDRODIURIL) 25 MG tablet Take 25 mg by mouth daily. 08/15/21   [provider]   lidocaine (LIDODERM) 5 % Place 1 patch onto the skin daily. Remove & Discard patch within 12 hours or as directed by MD 08/25/21   Alvira Monday, MD  lisinopril (ZESTRIL) 2.5 MG tablet Take 2.5 mg by mouth daily. 06/19/21   [provider]  metoprolol tartrate (LOPRESSOR) 50 MG tablet Take 50 mg by mouth 2 (two) times daily. 05/06/21   [provider]  oxyCODONE (ROXICODONE) 5 MG immediate release tablet Take 1 tablet (5 mg total) by mouth every 4 (four) hours as needed for severe pain. 08/25/21   Alvira Monday, MD      Allergies    Patient has no known allergies.    Review of Systems   Review of Systems  Constitutional:  Negative for chills and fever.  HENT:  Negative for facial swelling and trouble swallowing.   Eyes:  Negative for photophobia and visual disturbance.  Respiratory:  Negative for cough and shortness of breath.   Cardiovascular:  Negative for chest pain and palpitations.  Gastrointestinal:  Negative for abdominal pain, nausea and vomiting.  Endocrine: Negative for polydipsia and polyuria.  Genitourinary:  Negative for difficulty urinating and hematuria.  Musculoskeletal:  Positive for arthralgias. Negative for gait problem and joint swelling.  Skin:  Negative for pallor and rash.  Neurological:  Negative for syncope and headaches.  Psychiatric/Behavioral:  Negative for agitation and  confusion.    Physical Exam Updated Vital Signs BP (!) 126/91 (BP Location: Right Arm)   Pulse 71   Temp 98.6 F (37 C) (Oral)   Resp 18   Ht 6' (1.829 m)   Wt 113.4 kg   SpO2 100%   BMI 33.91 kg/m  Physical Exam Vitals and nursing note reviewed.  Constitutional:      General: He is not in acute distress.    Appearance: He is well-developed.  HENT:     Head: Normocephalic and atraumatic.     Right Ear: External ear normal.     Left Ear: External ear normal.     Mouth/Throat:     Mouth: Mucous membranes are moist.  Eyes:     General: No scleral  icterus. Cardiovascular:     Rate and Rhythm: Normal rate and regular rhythm.     Pulses: Normal pulses.     Heart sounds: Normal heart sounds.  Pulmonary:     Effort: Pulmonary effort is normal. No respiratory distress.     Breath sounds: Normal breath sounds.  Abdominal:     General: Abdomen is flat.     Palpations: Abdomen is soft.     Tenderness: There is no abdominal tenderness.  Musculoskeletal:        General: Normal range of motion.     Cervical back: Normal range of motion.     Right lower leg: No edema.     Left lower leg: No edema.       Legs:     Comments: Minimal TTP.  No joint effusion.  No significant pain with varus and valgus testing of the right knee.  No hip or ankle pain.  Neuro vas intact lower extremities.  Gait steady.  Skin:    General: Skin is warm and dry.     Capillary Refill: Capillary refill takes less than 2 seconds.  Neurological:     Mental Status: He is alert and oriented to person, place, and time.  Psychiatric:        Mood and Affect: Mood normal.        Behavior: Behavior normal.    ED Results / Procedures / Treatments   Labs (all labs ordered are listed, but only abnormal results are displayed) Labs Reviewed - No data to display  EKG None  Radiology DG Knee Complete 4 Views Right  Result Date: 10/17/2021 CLINICAL DATA:  Right knee pain. EXAM: RIGHT KNEE - COMPLETE 4+ VIEW COMPARISON:  None Available. FINDINGS: No joint effusion. No acute fracture or dislocation. Mild tricompartment osteoarthritis. Soft tissues unremarkable. IMPRESSION: Mild tricompartment osteoarthritis. Electronically Signed   By: Signa Kellaylor  Stroud M.D.   On: 10/17/2021 09:19   DG Foot Complete Right  Result Date: 10/16/2021 CLINICAL DATA:  Atraumatic right foot pain for 2 days EXAM: RIGHT FOOT COMPLETE - 3+ VIEW COMPARISON:  07/16/2018 FINDINGS: There is no evidence of fracture or dislocation. Heel spur. No erosive changes or focal joint space narrowing. IMPRESSION: No  acute finding or change from 2020. Electronically Signed   By: Tiburcio PeaJonathan  Watts M.D.   On: 10/16/2021 08:22    Procedures Procedures    Medications Ordered in ED Medications  ketorolac (TORADOL) injection 60 mg (60 mg Intramuscular Given 10/17/21 0909)    ED Course/ Medical Decision Making/ A&P                           Medical Decision Making Amount and/or  Complexity of Data Reviewed Radiology: ordered.  Risk OTC drugs. Prescription drug management.    CC: Right knee pain  This patient presents to the Emergency Department for the above complaint. This involves an extensive number of treatment options and is a complaint that carries with it a high risk of complications and morbidity. Vital signs were reviewed. Serious etiologies considered.  Differential diagnosis for knee pain includes but not limited to: Gout, osteoarthritis, septic joint, sprain, strain, MSK, soft tissue, very reduced suspicion for DVT or vascular insufficiency other acute pathologies  Well score is low, no clinical evidence of DVT, DVT is unlikely  Record review:  Previous records obtained and reviewed   Patient was seen in the ER yesterday for foot pain, prior labs and imaging, prior outpatient consultant notes.  Additional history obtained from N/A  Medical and surgical history as noted above.   Work up as above, notable for:  Labs & imaging results that were available during my care of the patient were visualized by me and considered in my medical decision making.   I ordered imaging studies which included right knee complete x-ray. I visualized the imaging, interpreted images, and I agree with radiologist interpretation.  X-ray consistent with osteoarthritis  Management: Give Toradol, Ace wrap  Reassessment:  Patient feeling better  Admission was considered.   Symptoms today likely secondary to osteoarthritis of the right knee.  Doubt acute pathology or emergent pathology.  Advised patient  follow-up with sports medicine.  Continue home medications as prescribed.   The patient improved significantly and was discharged in stable condition. Detailed discussions were had with the patient regarding current findings, and need for close f/u with PCP or on call doctor. The patient has been instructed to return immediately if the symptoms worsen in any way for re-evaluation. Patient verbalized understanding and is in agreement with current care plan. All questions answered prior to discharge.            Social determinants of health include -  Social History   Socioeconomic History   Marital status: Single    Spouse name: Not on file   Number of children: Not on file   Years of education: Not on file   Highest education level: Not on file  Occupational History   Not on file  Tobacco Use   Smoking status: Never   Smokeless tobacco: Never  Vaping Use   Vaping Use: Never used  Substance and Sexual Activity   Alcohol use: No   Drug use: No   Sexual activity: Not on file  Other Topics Concern   Not on file  Social History Narrative   Not on file   Social Determinants of Health   Financial Resource Strain: Not on file  Food Insecurity: Not on file  Transportation Needs: Not on file  Physical Activity: Not on file  Stress: Not on file  Social Connections: Not on file  Intimate Partner Violence: Not on file      This chart was dictated using voice recognition software.  Despite best efforts to proofread,  errors can occur which can change the documentation meaning.         Final Clinical Impression(s) / ED Diagnoses Final diagnoses:  Acute pain of right knee  Osteoarthritis of right knee, unspecified osteoarthritis type    Rx / DC Orders ED Discharge Orders          Ordered    ibuprofen (ADVIL) 600 MG tablet  Every 6 hours PRN  10/17/21 0924    acetaminophen (TYLENOL) 325 MG tablet  Every 6 hours PRN        10/17/21 0924               Sloan Leiter, DO 10/17/21 [phone]

## 2021-10-17 NOTE — ED Notes (Signed)
X-ray at bedside

## 2021-10-23 ENCOUNTER — Emergency Department (HOSPITAL_BASED_OUTPATIENT_CLINIC_OR_DEPARTMENT_OTHER)
Admission: EM | Admit: 2021-10-23 | Discharge: 2021-10-23 | Disposition: A | Discharge status: Home / Self Care | Payer: BC Managed Care – PPO | Attending: Emergency Medicine | Admitting: Emergency Medicine

## 2021-10-23 ENCOUNTER — Other Ambulatory Visit: Payer: Self-pay

## 2021-10-23 ENCOUNTER — Encounter (HOSPITAL_BASED_OUTPATIENT_CLINIC_OR_DEPARTMENT_OTHER): Payer: Self-pay | Admitting: Emergency Medicine

## 2021-10-23 DIAGNOSIS — M109 Gout, unspecified: Secondary | ICD-10-CM

## 2021-10-23 DIAGNOSIS — M10071 Idiopathic gout, right ankle and foot: Secondary | ICD-10-CM | POA: Insufficient documentation

## 2021-10-23 DIAGNOSIS — Z79899 Other long term (current) drug therapy: Secondary | ICD-10-CM | POA: Diagnosis not present

## 2021-10-23 DIAGNOSIS — M79671 Pain in right foot: Secondary | ICD-10-CM | POA: Diagnosis present

## 2021-10-23 MED ORDER — COLCHICINE 0.6 MG PO TABS: ORAL_TABLET | ORAL | 0 refills | Status: DC

## 2021-10-23 NOTE — Discharge Instructions (Signed)
Follow-up with your doctor on Wednesday as scheduled.  Start taking your allopurinol.  Take the colchicine just for 1 day.  You can also use Tylenol.  Return to the emergency room if you have any worsening symptoms.

## 2021-10-23 NOTE — ED Provider Notes (Signed)
[facility] EMERGENCY DEPARTMENT Provider Note   CSN: [id number] Arrival date & time: 10/23/21  0820     History  Chief Complaint  Patient presents with   Foot Pain    Adrian Cole is a 56 y.o. male.  Patient is a 56 year old male who presents with some pain in his right foot.  He feels like it is gout flareup.  He is concerned because he is out of his colchicine.  He tried to contact his PCP but has not gotten a refill yet.  He denies any injuries to the foot.  Its been acting up for the last couple days.  Although over the last few months he has had flareups in different places including his elbow, knee and prior flareup in his right foot.  He has an appointment with his doctor this week.  No fevers.        Home Medications Prior to Admission medications   Medication Sig Start Date End Date Taking? Authorizing Provider  acetaminophen (TYLENOL) 325 MG tablet Take 2 tablets (650 mg total) by mouth every 6 (six) hours as needed. 10/17/21  Yes Tanda Rockers A, DO  allopurinol (ZYLOPRIM) 100 MG tablet Take by mouth. 06/01/21  Yes [provider]  atorvastatin (LIPITOR) 80 MG tablet Take 80 mg by mouth at bedtime. 06/29/21  Yes [provider]  glyBURIDE (DIABETA) 5 MG tablet Take 5 mg by mouth daily. 08/15/21  Yes [provider]  hydrochlorothiazide (HYDRODIURIL) 25 MG tablet Take 25 mg by mouth daily. 08/15/21  Yes [provider]  ibuprofen (ADVIL) 600 MG tablet Take 1 tablet (600 mg total) by mouth every 6 (six) hours as needed. 10/17/21  Yes Tanda Rockers A, DO  lidocaine (LIDODERM) 5 % Place 1 patch onto the skin daily. Remove & Discard patch within 12 hours or as directed by MD 08/25/21  Yes Alvira Monday, MD  lisinopril (ZESTRIL) 2.5 MG tablet Take 2.5 mg by mouth daily. 06/19/21  Yes [provider]  metoprolol tartrate (LOPRESSOR) 50 MG tablet Take 50 mg by mouth 2 (two) times daily. 05/06/21  Yes [provider]   colchicine 0.6 MG tablet Take 2 tablets (1.2mg ) followed by one tablet one hour later. 10/23/21   Rolan Bucco, MD  oxyCODONE (ROXICODONE) 5 MG immediate release tablet Take 1 tablet (5 mg total) by mouth every 4 (four) hours as needed for severe pain. 08/25/21   Alvira Monday, MD      Allergies    Patient has no known allergies.    Review of Systems   Review of Systems  Constitutional:  Negative for fever.  Gastrointestinal:  Negative for nausea and vomiting.  Musculoskeletal:  Positive for arthralgias.  Skin:  Negative for rash and wound.  Neurological:  Negative for weakness and numbness.   Physical Exam Updated Vital Signs BP 122/86   Pulse 82   Temp 98 F (36.7 C)   SpO2 100%  Physical Exam Constitutional:      Appearance: Normal appearance.  Cardiovascular:     Rate and Rhythm: Normal rate.  Pulmonary:     Effort: Pulmonary effort is normal.  Musculoskeletal:     Comments: Patient has some mild tenderness to the lateral aspect of the right foot.  No swelling or deformities noted.  No warmth or erythema.  Pedal pulses are intact.  There is no wounds.  Neurovascularly intact.  Skin:    General: Skin is warm and dry.  Neurological:  General: No focal deficit present.     Mental Status: He is alert.    ED Results / Procedures / Treatments   Labs (all labs ordered are listed, but only abnormal results are displayed) Labs Reviewed - No data to display  EKG None  Radiology No results found.  Procedures Procedures    Medications Ordered in ED Medications - No data to display  ED Course/ Medical Decision Making/ A&P                           Medical Decision Making Risk Prescription drug management.   Patient is concerned because he keeps having flareups of his gout.  He has some mild inflammation of his right foot.  He is requesting some colchicine.  He was given a short course of colchicine prescription.  Question was had about restarting his  allopurinol.  He thinks he has some at home but does not think he is currently taking it.  He has an appointment with his PCP on Wednesday where he can discuss this further.  He will also take Tylenol for pain.  He was advised not to use NSAIDs due to his kidney function.  He does not have any evidence of trauma to the foot.  He has had recent x-rays so I do not feel that these need to be repeated.  There is no suggestions of infection.  Was discharged home in good condition.  We will follow-up with his PCP on Wednesday.  Return precautions were given.  Final Clinical Impression(s) / ED Diagnoses Final diagnoses:  Acute gout of right foot, unspecified cause    Rx / DC Orders ED Discharge Orders          Ordered    colchicine 0.6 MG tablet        10/23/21 0858              Rolan Bucco, MD 10/23/21 [phone]

## 2021-10-23 NOTE — ED Triage Notes (Addendum)
Patient states that he has had pain in his right feet for a couple days . Denies any injury.Denies any swelling.States that  he has some soreness.States that he has gout. Patient states that he has not taken his gout medication due to needing a refill

## 2021-10-27 ENCOUNTER — Encounter: Payer: Self-pay | Admitting: Family Medicine

## 2021-10-27 ENCOUNTER — Ambulatory Visit (INDEPENDENT_AMBULATORY_CARE_PROVIDER_SITE_OTHER): Payer: BC Managed Care – PPO | Admitting: Family Medicine

## 2021-10-27 DIAGNOSIS — M10061 Idiopathic gout, right knee: Secondary | ICD-10-CM | POA: Diagnosis not present

## 2021-10-27 NOTE — Assessment & Plan Note (Addendum)
Acute on chronic in nature.  Has had a few recent exacerbations of gout in his foot as well as the knee.  X-ray was reassuring at the knee. -Counseled on home exercise therapy and supportive care. -Counseled on colchicine. -May need to consider referral to rheumatology.

## 2021-10-27 NOTE — Progress Notes (Signed)
  Adrian Cole - 56 y.o. male MRN [id number]  Date of birth: 09/11/1965  SUBJECTIVE:  Including CC & ROS.  No chief complaint on file.   Adrian Cole is a 56 y.o. male that is following up for his right knee and foot pain.  He was observed in the emergency department on 5/28 as well as 6/3.  He was out of his colchicine.  He is feeling good today since restarting the medication..  Independent review of the right knee x-ray from 5/28 shows mild medial joint space narrowing.  Review of Systems See HPI   HISTORY: Past Medical, Surgical, Social, and Family History Reviewed & Updated per EMR.   Pertinent Historical Findings include:  Past Medical History:  Diagnosis Date   Gout    Hypertension     Past Surgical History:  Procedure Laterality Date   COLONOSCOPY       PHYSICAL EXAM:  VS: Ht 6' (1.829 m)   Wt 250 lb (113.4 kg)   BMI 33.91 kg/m  Physical Exam Gen: NAD, alert, cooperative with exam, well-appearing MSK:  Neurovascularly intact       ASSESSMENT & PLAN:   Acute idiopathic gout of right knee Acute on chronic in nature.  Has had a few recent exacerbations of gout in his foot as well as the knee.  X-ray was reassuring at the knee. -Counseled on home exercise therapy and supportive care. -Counseled on colchicine. -May need to consider referral to rheumatology.

## 2022-01-18 ENCOUNTER — Other Ambulatory Visit: Payer: Self-pay

## 2022-01-18 ENCOUNTER — Encounter (HOSPITAL_BASED_OUTPATIENT_CLINIC_OR_DEPARTMENT_OTHER): Payer: Self-pay | Admitting: Emergency Medicine

## 2022-01-18 ENCOUNTER — Other Ambulatory Visit (HOSPITAL_BASED_OUTPATIENT_CLINIC_OR_DEPARTMENT_OTHER): Payer: Self-pay

## 2022-01-18 ENCOUNTER — Emergency Department (HOSPITAL_BASED_OUTPATIENT_CLINIC_OR_DEPARTMENT_OTHER)
Admission: EM | Admit: 2022-01-18 | Discharge: 2022-01-18 | Disposition: A | Discharge status: Home / Self Care | Payer: BC Managed Care – PPO | Attending: Emergency Medicine | Admitting: Emergency Medicine

## 2022-01-18 DIAGNOSIS — Z79899 Other long term (current) drug therapy: Secondary | ICD-10-CM | POA: Diagnosis not present

## 2022-01-18 DIAGNOSIS — Z760 Encounter for issue of repeat prescription: Secondary | ICD-10-CM

## 2022-01-18 DIAGNOSIS — M25561 Pain in right knee: Secondary | ICD-10-CM

## 2022-01-18 DIAGNOSIS — M109 Gout, unspecified: Secondary | ICD-10-CM | POA: Diagnosis not present

## 2022-01-18 DIAGNOSIS — I10 Essential (primary) hypertension: Secondary | ICD-10-CM | POA: Diagnosis not present

## 2022-01-18 MED ORDER — ALLOPURINOL 100 MG PO TABS
100.0000 mg | ORAL_TABLET | Freq: Every day | ORAL | 0 refills | Status: AC
  Filled 2022-01-18: qty 30, 30d supply, fill #0

## 2022-01-18 MED ORDER — COLCHICINE 0.6 MG PO TABS
ORAL_TABLET | ORAL | 0 refills | Status: DC
  Filled 2022-01-18: qty 10, 3d supply, fill #0

## 2022-01-18 NOTE — ED Triage Notes (Signed)
Pt arrives pov, slow gait, endorses RT knee pain, hx of gout. Reports needing refill on

## 2022-01-18 NOTE — ED Provider Notes (Signed)
[facility] EMERGENCY DEPARTMENT Provider Note   CSN: [id number] Arrival date & time: 01/18/22  4166     History  Chief Complaint  Patient presents with   Knee Pain   Medication Refill    Adrian Cole is a 56 y.o. male with a history of gout and hypertension who presents the emergency department complaining of right knee pain for 1 week.  Patient states this is a typical site of his gout.  He ran out of his colchicine medication.  He is unsure if he has any more allopurinol at home.  Denies any injuries to the knee.  Is otherwise been feeling well.  Denies any fevers or recent illness.    Knee Pain Associated symptoms: no fever   Medication Refill      Home Medications Prior to Admission medications   Medication Sig Start Date End Date Taking? Authorizing Provider  acetaminophen (TYLENOL) 325 MG tablet Take 2 tablets (650 mg total) by mouth every 6 (six) hours as needed. 10/17/21   Sloan Leiter, DO  allopurinol (ZYLOPRIM) 100 MG tablet Take 1 tablet (100 mg total) by mouth daily. 01/18/22   Zeda Gangwer T, PA-C  atorvastatin (LIPITOR) 80 MG tablet Take 80 mg by mouth at bedtime. 06/29/21   [provider]  colchicine 0.6 MG tablet Take 2 tablets (1.2mg ) followed by one tablet one hour later. 01/18/22   Monic Engelmann T, PA-C  glyBURIDE (DIABETA) 5 MG tablet Take 5 mg by mouth daily. 08/15/21   [provider]  hydrochlorothiazide (HYDRODIURIL) 25 MG tablet Take 25 mg by mouth daily. 08/15/21   [provider]  ibuprofen (ADVIL) 600 MG tablet Take 1 tablet (600 mg total) by mouth every 6 (six) hours as needed. 10/17/21   Tanda Rockers A, DO  lidocaine (LIDODERM) 5 % Place 1 patch onto the skin daily. Remove & Discard patch within 12 hours or as directed by MD 08/25/21   Alvira Monday, MD  lisinopril (ZESTRIL) 2.5 MG tablet Take 2.5 mg by mouth daily. 06/19/21   [provider]  metoprolol tartrate (LOPRESSOR) 50 MG tablet Take 50 mg  by mouth 2 (two) times daily. 05/06/21   [provider]  oxyCODONE (ROXICODONE) 5 MG immediate release tablet Take 1 tablet (5 mg total) by mouth every 4 (four) hours as needed for severe pain. 08/25/21   Alvira Monday, MD      Allergies    Patient has no known allergies.    Review of Systems   Review of Systems  Constitutional:  Negative for chills and fever.  Musculoskeletal:  Positive for arthralgias.  All other systems reviewed and are negative.   Physical Exam Updated Vital Signs BP 124/78 (BP Location: Left Arm)   Pulse 88   Temp 98.7 F (37.1 C)   Resp 18   Ht 6' (1.829 m)   Wt 120.2 kg   SpO2 100%   BMI 35.94 kg/m  Physical Exam Vitals and nursing note reviewed.  Constitutional:      Appearance: Normal appearance.  HENT:     Head: Normocephalic and atraumatic.  Eyes:     Conjunctiva/sclera: Conjunctivae normal.  Pulmonary:     Effort: Pulmonary effort is normal. No respiratory distress.  Musculoskeletal:     Comments: Tenderness to anterior medial knee with increased warmth.  No overlying skin changes.  Normal range of motion.  No effusion.  Skin:    General: Skin is warm and dry.  Neurological:  Mental Status: He is alert.  Psychiatric:        Mood and Affect: Mood normal.        Behavior: Behavior normal.     ED Results / Procedures / Treatments   Labs (all labs ordered are listed, but only abnormal results are displayed) Labs Reviewed - No data to display  EKG None  Radiology No results found.  Procedures Procedures    Medications Ordered in ED Medications - No data to display  ED Course/ Medical Decision Making/ A&P                           Medical Decision Making Risk Prescription drug management.   Patient is a 56 year old male with history of gout and hypertension who presents the emergency department complaining of knee pain which she believes is a gout flare.  Recently ran out of his colchicine medication.  Has  otherwise been feeling well, no fevers or recent illness.  On exam patient has normal vital signs.  Tenderness to anterior medial knee with increased warmth.  No overlying skin changes or wounds.  Normal range of motion.  Low concern for septic joint.  Symptoms appear consistent with patient's history of gout.  Will refill colchicine, as well as his allopurinol.  He is unsure if he has any more of this at home.  Recommended following up with his primary care doctor to discuss long-term management of his symptoms.  We discussed reasons to return to the emergency department, patient's agreeable to the plan.        Final Clinical Impression(s) / ED Diagnoses Final diagnoses:  Acute pain of right knee  Encounter for medication refill  Acute gout of right knee, unspecified cause    Rx / DC Orders ED Discharge Orders          Ordered    allopurinol (ZYLOPRIM) 100 MG tablet  Daily        01/18/22 0924    colchicine 0.6 MG tablet        01/18/22 3762           Portions of this report may have been transcribed using voice recognition software. Every effort was made to ensure accuracy; however, inadvertent computerized transcription errors may be present.    Jamieson Hetland T, PA-C 01/18/22 0929    Franne Forts, DO 01/21/22 1617

## 2022-01-18 NOTE — Discharge Instructions (Signed)
You were seen in the emergency department today for knee pain and requesting a medication refill.  I have refilled both your allopurinol and your colchicine medicine.  For the colchicine today I would like you to take 2 tablets, followed by 1 tablet 1 hour later.  The allopurinol you can start taking 1 tablet daily.  I recommend following up with your primary doctor to discuss long-term medication management.  Continue to monitor how you're doing and return to the ER for new or worsening symptoms.

## 2022-02-10 ENCOUNTER — Other Ambulatory Visit (HOSPITAL_BASED_OUTPATIENT_CLINIC_OR_DEPARTMENT_OTHER): Payer: Self-pay

## 2022-02-10 ENCOUNTER — Ambulatory Visit (INDEPENDENT_AMBULATORY_CARE_PROVIDER_SITE_OTHER): Payer: BC Managed Care – PPO | Admitting: Family Medicine

## 2022-02-10 ENCOUNTER — Encounter: Payer: Self-pay | Admitting: Family Medicine

## 2022-02-10 VITALS — BP 120/72 | Ht 72.0 in | Wt 265.0 lb

## 2022-02-10 DIAGNOSIS — M659 Synovitis and tenosynovitis, unspecified: Secondary | ICD-10-CM

## 2022-02-10 DIAGNOSIS — M65972 Unspecified synovitis and tenosynovitis, left ankle and foot: Secondary | ICD-10-CM | POA: Insufficient documentation

## 2022-02-10 MED ORDER — COLCHICINE 0.6 MG PO TABS: 0.6000 mg | ORAL_TABLET | Freq: Two times a day (BID) | ORAL | 1 refills | Status: DC

## 2022-02-10 MED ORDER — COLCHICINE 0.6 MG PO TABS
0.6000 mg | ORAL_TABLET | Freq: Two times a day (BID) | ORAL | 1 refills | Status: AC
  Filled 2022-02-10 (×2): qty 90, 45d supply, fill #0

## 2022-02-10 NOTE — Progress Notes (Signed)
  [name] - 56 y.o. male MRN [id number]  Date of birth: Sep 04, 1965  SUBJECTIVE:  Including CC & ROS.  No chief complaint on file.   Adrian Cole is a 56 y.o. male that is presenting with acute left ankle pain.  The pain is started a few days ago.  He does not have any colchicine.  He was advised to avoid anti-inflammatories due to his kidney function.  No injury or inciting event.    Review of Systems See HPI   HISTORY: Past Medical, Surgical, Social, and Family History Reviewed & Updated per EMR.   Pertinent Historical Findings include:  Past Medical History:  Diagnosis Date   Gout    Hypertension     Past Surgical History:  Procedure Laterality Date   COLONOSCOPY       PHYSICAL EXAM:  VS: BP 120/72 (BP Location: Left Arm, Patient Position: Sitting)   Ht 6' (1.829 m)   Wt 265 lb (120.2 kg)   BMI 35.94 kg/m  Physical Exam Gen: NAD, alert, cooperative with exam, well-appearing MSK:  Neurovascularly intact       ASSESSMENT & PLAN:   Synovitis of left ankle Acute on chronic in nature.  Has limited range of motion and swelling appreciated around the ankle.  Most likely related to his underlying gout.  Does not have any colchicine at home. -Counseled on home exercise and supportive care. -Colchicine. -Could consider injection

## 2022-02-10 NOTE — Patient Instructions (Signed)
Good to see you Please use ice as needed  Please use the colchicine until 2 days of being pain free  Please send me a message in MyChart with any questions or updates.  Please see me back as needed.   --Dr. Raeford Razor

## 2022-02-10 NOTE — Addendum Note (Signed)
Addended by: Cresenciano Lick on: 02/10/2022 03:44 PM   Modules accepted: Orders

## 2022-02-10 NOTE — Assessment & Plan Note (Signed)
Acute on chronic in nature.  Has limited range of motion and swelling appreciated around the ankle.  Most likely related to his underlying gout.  Does not have any colchicine at home. -Counseled on home exercise and supportive care. -Colchicine. -Could consider injection

## 2022-05-31 IMAGING — DX DG KNEE COMPLETE 4+V*L*
4 series · 4 of 4 positions shown · non-contrast
Comparison: Left knee x-rays dated April 02, 2020.

CLINICAL DATA: Left knee pain for the past 2 days.

EXAM:
LEFT KNEE - COMPLETE 4+ VIEW

[knee ap]
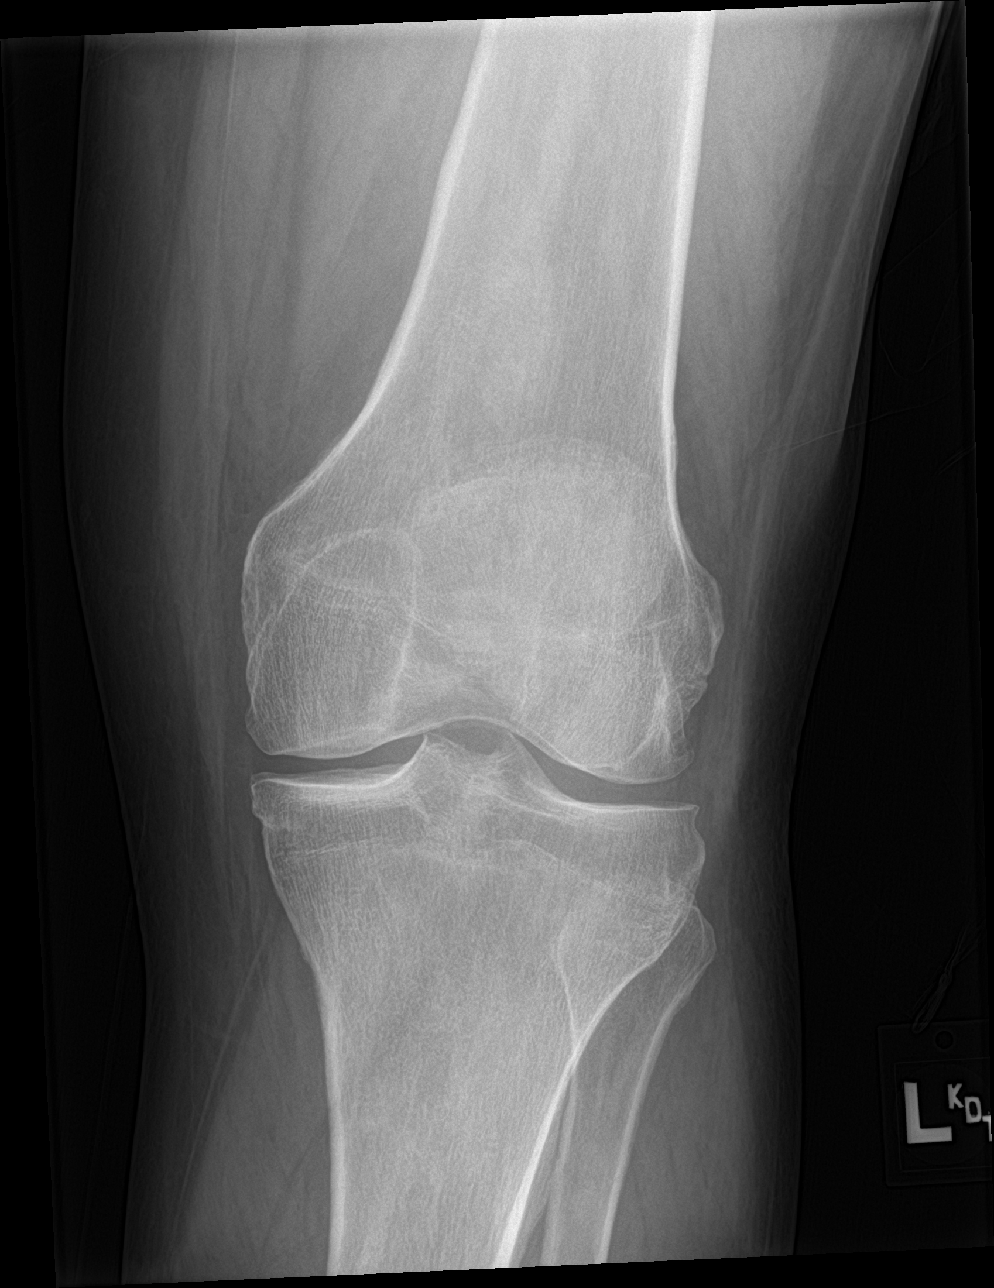

[knee lat]
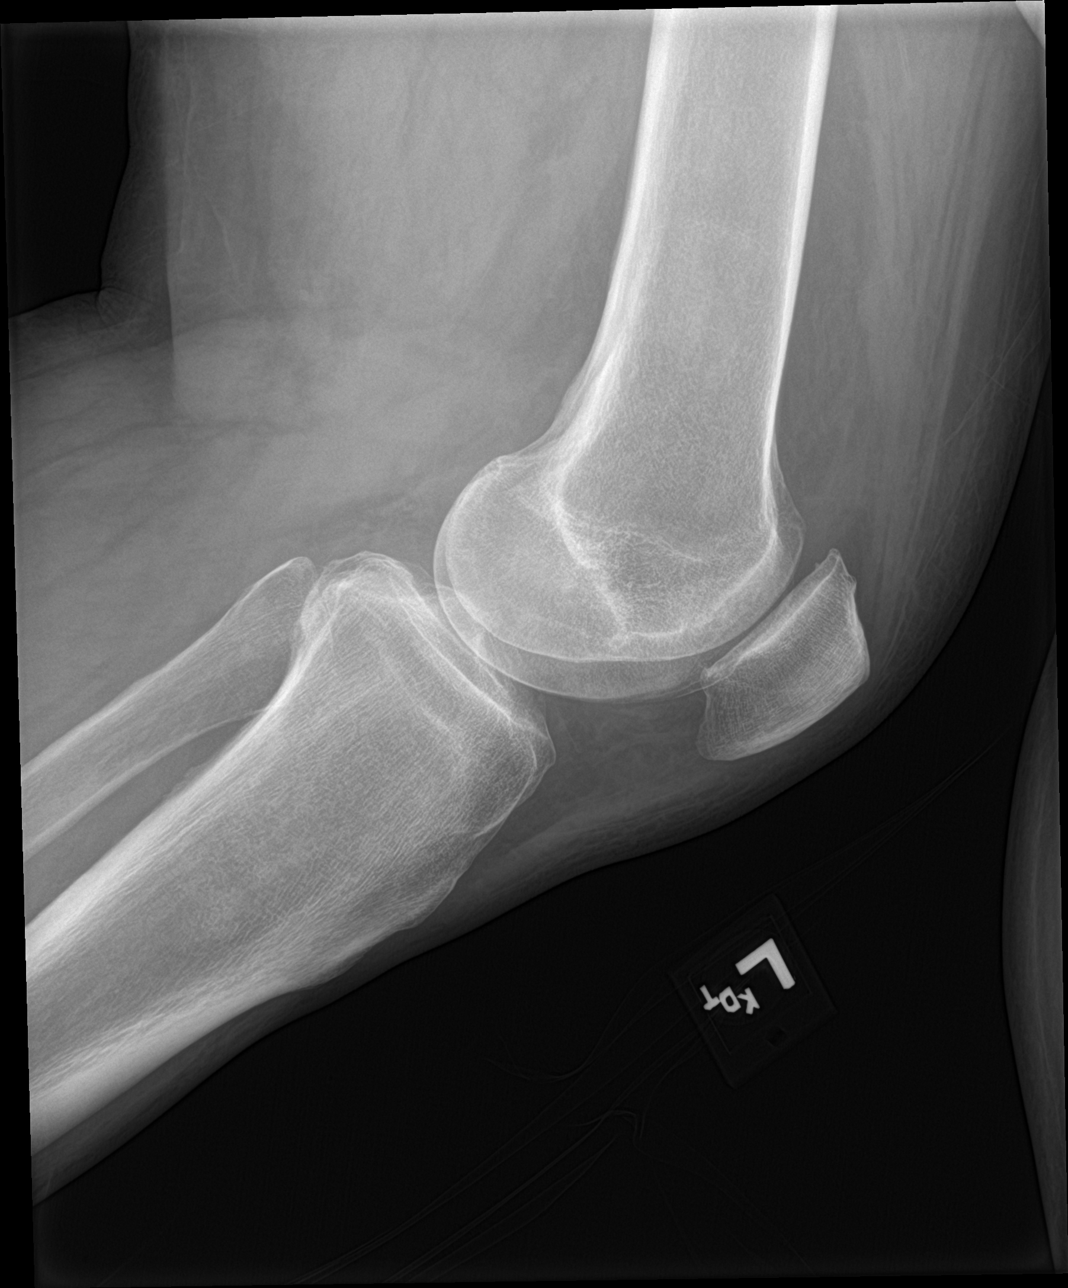

[knee obl (1 of 2)]
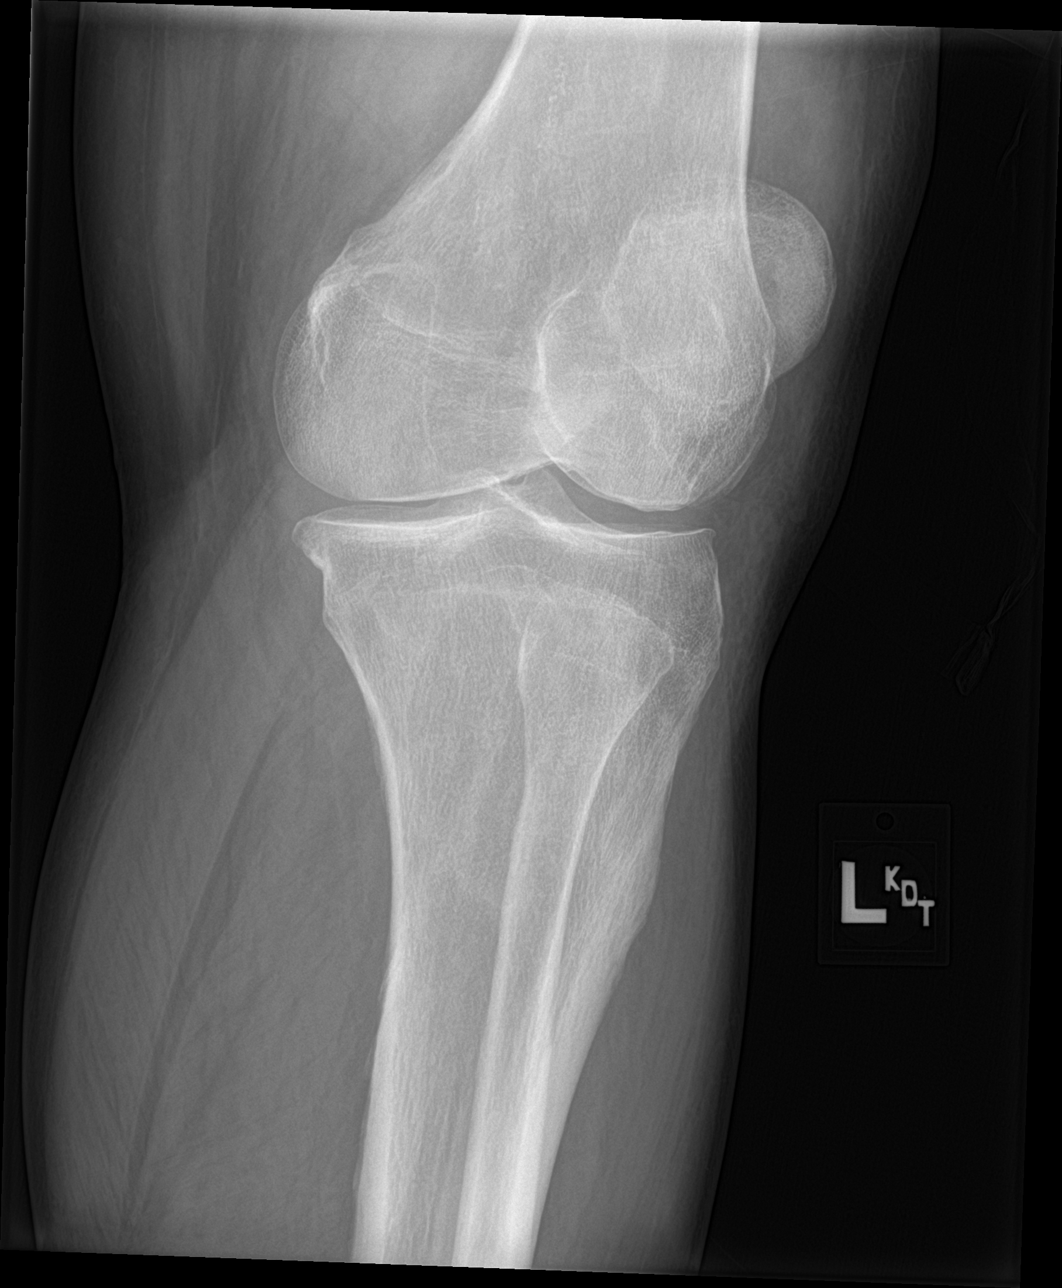

[knee obl (2 of 2)]
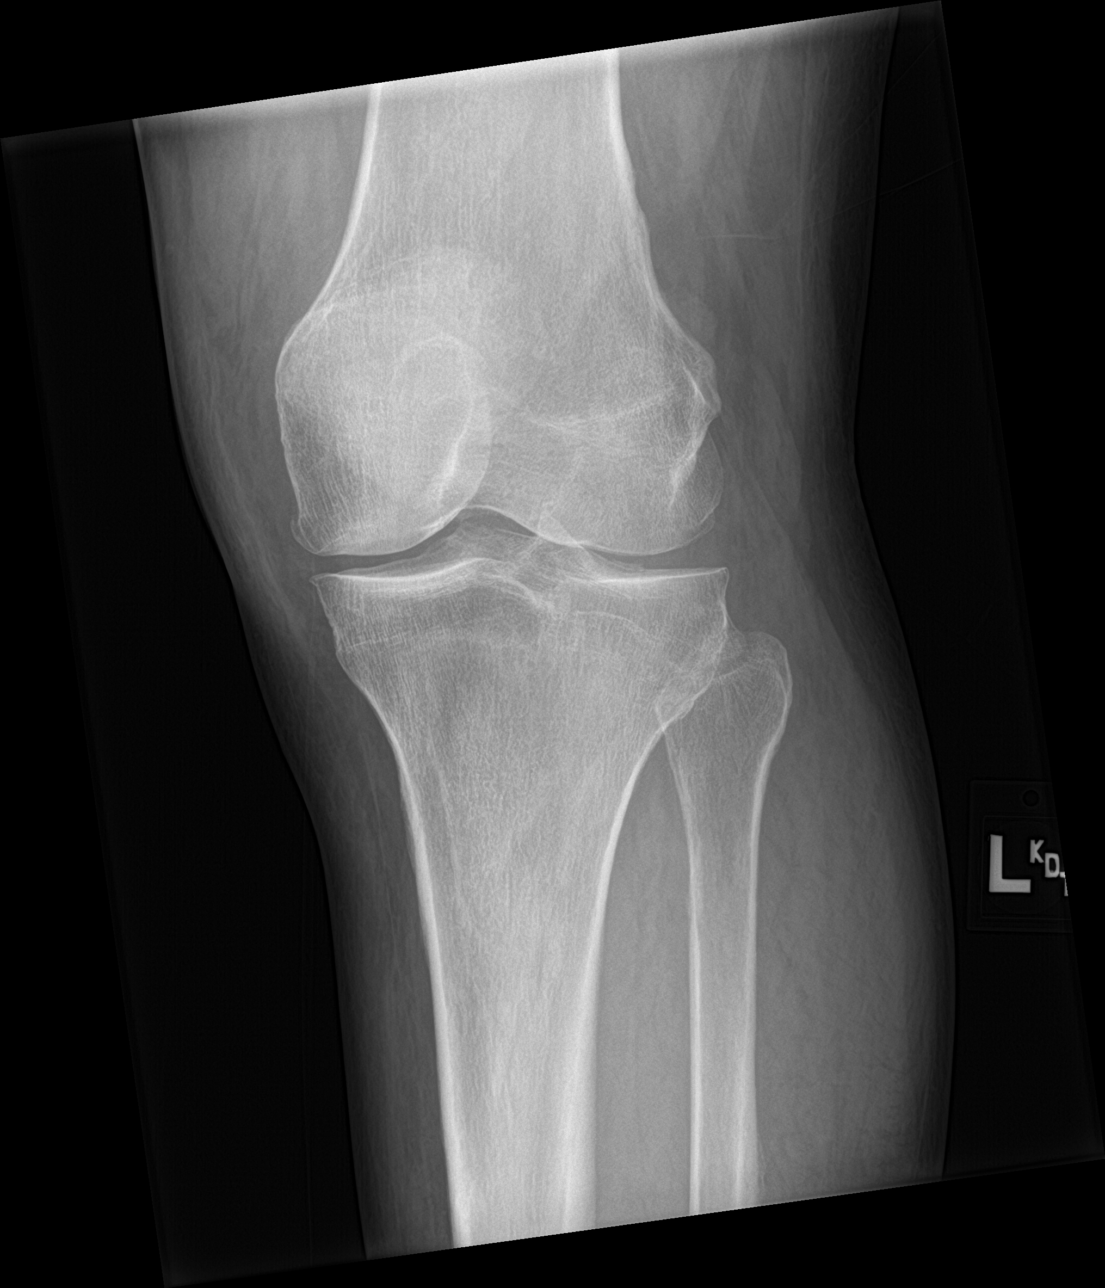

[4 of 4 positions shown; findings below may reference images not displayed]

FINDINGS: No acute fracture or dislocation. No joint effusion. Unchanged mild
medial and lateral compartment joint space narrowing with small
tricompartmental marginal osteophytes. Soft tissues are
unremarkable.
IMPRESSION: 1.  No acute osseous abnormality.
2. Unchanged mild tricompartmental osteoarthritis.

## 2022-09-07 ENCOUNTER — Encounter: Payer: Self-pay | Admitting: *Deleted

## 2023-07-09 ENCOUNTER — Emergency Department (HOSPITAL_BASED_OUTPATIENT_CLINIC_OR_DEPARTMENT_OTHER)
Admission: EM | Admit: 2023-07-09 | Discharge: 2023-07-09 | Disposition: A | Discharge status: Home / Self Care | Payer: BC Managed Care – PPO | Attending: Emergency Medicine | Admitting: Emergency Medicine

## 2023-07-09 ENCOUNTER — Other Ambulatory Visit: Payer: Self-pay

## 2023-07-09 ENCOUNTER — Encounter (HOSPITAL_BASED_OUTPATIENT_CLINIC_OR_DEPARTMENT_OTHER): Payer: Self-pay | Admitting: Urology

## 2023-07-09 ENCOUNTER — Emergency Department (HOSPITAL_BASED_OUTPATIENT_CLINIC_OR_DEPARTMENT_OTHER): Payer: BC Managed Care – PPO

## 2023-07-09 DIAGNOSIS — Z79899 Other long term (current) drug therapy: Secondary | ICD-10-CM | POA: Insufficient documentation

## 2023-07-09 DIAGNOSIS — E669 Obesity, unspecified: Secondary | ICD-10-CM | POA: Insufficient documentation

## 2023-07-09 DIAGNOSIS — J101 Influenza due to other identified influenza virus with other respiratory manifestations: Secondary | ICD-10-CM | POA: Diagnosis not present

## 2023-07-09 DIAGNOSIS — I10 Essential (primary) hypertension: Secondary | ICD-10-CM | POA: Diagnosis not present

## 2023-07-09 DIAGNOSIS — Z6835 Body mass index (BMI) 35.0-35.9, adult: Secondary | ICD-10-CM | POA: Insufficient documentation

## 2023-07-09 DIAGNOSIS — R6889 Other general symptoms and signs: Secondary | ICD-10-CM | POA: Diagnosis present

## 2023-07-09 LAB — RESP PANEL BY RT-PCR (RSV, FLU A&B, COVID)  RVPGX2
Influenza A by PCR: POSITIVE — AB
Influenza B by PCR: NEGATIVE
Resp Syncytial Virus by PCR: NEGATIVE
SARS Coronavirus 2 by RT PCR: NEGATIVE

## 2023-07-09 MED ORDER — ONDANSETRON HCL 4 MG PO TABS: 4.0000 mg | ORAL_TABLET | Freq: Three times a day (TID) | ORAL | Status: AC | PRN

## 2023-07-09 MED ORDER — ACETAMINOPHEN 325 MG PO TABS: 650.0000 mg | ORAL_TABLET | Freq: Four times a day (QID) | ORAL | 0 refills | Status: AC | PRN

## 2023-07-09 MED ORDER — GUAIFENESIN-CODEINE 100-10 MG/5ML PO SOLN: 10.0000 mL | Freq: Two times a day (BID) | ORAL | 0 refills | Status: AC | PRN

## 2023-07-09 MED ORDER — OXYMETAZOLINE HCL 0.05 % NA SOLN: 1.0000 | Freq: Two times a day (BID) | NASAL | 0 refills | Status: AC

## 2023-07-09 MED ORDER — KETOROLAC TROMETHAMINE 15 MG/ML IJ SOLN
15.0000 mg | Freq: Once | INTRAMUSCULAR | Status: AC
  Administered 2023-07-09: 15 mg via INTRAMUSCULAR
  Filled 2023-07-09: qty 1

## 2023-07-09 MED ORDER — IBUPROFEN 600 MG PO TABS: 600.0000 mg | ORAL_TABLET | Freq: Four times a day (QID) | ORAL | 0 refills | Status: AC | PRN

## 2023-07-09 NOTE — Discharge Instructions (Addendum)
 You have been seen in the Emergency Department (ED) today for a likely viral illness.  Please drink plenty of clear fluids (water, Gatorade, chicken broth, etc).  You may use Tylenol and/or Motrin according to label instructions.  You can alternate between the two without any side effects.  Recommend using a humidifier in your bedroom.  Eat a bland diet.  Get plenty of rest.  Please follow up with your doctor as listed above.  Call your doctor or return to the Emergency Department (ED) if you are unable to tolerate fluids due to vomiting, have worsening trouble breathing, become extremely tired or difficult to awaken, or if you develop any other symptoms that concern you.

## 2023-07-09 NOTE — ED Triage Notes (Signed)
 Pt states cough, headache, abd pain, since Thursday  States dry cough  Uknown fever but states chills

## 2023-07-09 NOTE — ED Provider Notes (Signed)
 [place] EMERGENCY DEPARTMENT AT [facility] Provider Note  CSN: [id number] Arrival date & time: 07/09/23 1007  Chief Complaint(s) Flu like symptoms   HPI Adrian Cole is a 59 y.o. male with past medical history as below, significant for gout, hypertension who presents to the ED with complaint of cough, URI symptoms  Feeling unwell since Thursday/Friday; girlfriend w/ similar symptoms.  Coughing, nonproductive, body aches, fevers chills, nausea.  No diarrhea.  He is tolerating p.o..  No rashes.  No travel.   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 Authorizing Provider  acetaminophen (TYLENOL) 325 MG tablet Take 2 tablets (650 mg total) by mouth every 6 (six) hours as needed. 07/09/23  Yes Tanda Rockers A, DO  guaiFENesin-codeine 100-10 MG/5ML syrup Take 10 mLs by mouth 2 (two) times daily as needed for cough. 07/09/23  Yes Tanda Rockers A, DO  ibuprofen (ADVIL) 600 MG tablet Take 1 tablet (600 mg total) by mouth every 6 (six) hours as needed. 07/09/23  Yes Tanda Rockers A, DO  ondansetron (ZOFRAN) 4 MG tablet Take 1 tablet (4 mg total) by mouth every 8 (eight) hours as needed for nausea or vomiting. 07/09/23  Yes Sloan Leiter, DO  oxymetazoline (AFRIN NASAL SPRAY) 0.05 % nasal spray Place  1 spray into both nostrils 2 (two) times daily for 3 days. 07/09/23 07/12/23 Yes Sloan Leiter, DO  allopurinol (ZYLOPRIM) 100 MG tablet Take 1 tablet (100 mg total) by mouth daily. 01/18/22   Roemhildt, Lorin T, PA-C  atorvastatin (LIPITOR) 80 MG tablet Take 80 mg by mouth at bedtime. 06/29/21   [provider]  colchicine 0.6 MG tablet Take 1 tablet (0.6 mg total) by mouth 2 (two) times daily. 02/10/22   Myra Rude, MD  glyBURIDE (DIABETA) 5 MG tablet Take 5 mg by mouth daily. 08/15/21   [provider]  hydrochlorothiazide (HYDRODIURIL) 25 MG tablet Take 25 mg by mouth daily. 08/15/21   [provider]  lidocaine (LIDODERM) 5 % Place 1 patch onto the skin daily. Remove & Discard patch within 12 hours or as directed by MD 08/25/21   Alvira Monday, MD  lisinopril (ZESTRIL) 2.5 MG tablet Take 2.5 mg by mouth daily. 06/19/21   [provider]  metoprolol tartrate (LOPRESSOR) 50 MG tablet Take 50 mg by mouth 2 (two) times daily. 05/06/21   [provider]  oxyCODONE (ROXICODONE) 5 MG immediate release tablet Take 1 tablet (5 mg total) by mouth every 4 (four) hours as needed for severe pain. 08/25/21   Alvira Monday, MD  Past Surgical History Past Surgical History:  Procedure Laterality Date   COLONOSCOPY     Family History Family History  Problem Relation Age of Onset   Hypertension Mother     Social History Social History   Tobacco Use   Smoking status: Never   Smokeless tobacco: Never  Vaping Use   Vaping status: Never Used  Substance Use Topics   Alcohol use: No   Drug use: Yes    Types: Marijuana   Allergies Patient has no known allergies.  Review of Systems A thorough review of systems was obtained and all systems are negative except as noted in the HPI and PMH.   Physical Exam Vital Signs  I have  reviewed the triage vital signs BP (!) 146/79 (BP Location: Right Arm)   Pulse 79   Temp 100.1 F (37.8 C) (Oral)   Resp 18   Ht 6' (1.829 m)   Wt 120 kg   SpO2 96%   BMI 35.88 kg/m  Physical Exam Vitals and nursing note reviewed.  Constitutional:      General: He is not in acute distress.    Appearance: He is well-developed. He is obese.  HENT:     Head: Normocephalic and atraumatic.     Right Ear: External ear normal.     Left Ear: External ear normal.     Mouth/Throat:     Mouth: Mucous membranes are moist.  Eyes:     General: No scleral icterus. Cardiovascular:     Rate and Rhythm: Normal rate and regular rhythm.     Pulses: Normal pulses.     Heart sounds: Normal heart sounds.  Pulmonary:     Effort: Pulmonary effort is normal. No respiratory distress.     Breath sounds: Normal breath sounds.  Abdominal:     General: Abdomen is flat.     Palpations: Abdomen is soft.     Tenderness: There is no abdominal tenderness.  Musculoskeletal:     Cervical back: No rigidity.     Right lower leg: No edema.     Left lower leg: No edema.  Skin:    General: Skin is warm and dry.     Capillary Refill: Capillary refill takes less than 2 seconds.  Neurological:     Mental Status: He is alert.  Psychiatric:        Mood and Affect: Mood normal.        Behavior: Behavior normal.     ED Results and Treatments Labs (all labs ordered are listed, but only abnormal results are displayed) Labs Reviewed  RESP PANEL BY RT-PCR (RSV, FLU A&B, COVID)  RVPGX2 - Abnormal; Notable for the following components:      Result Value   Influenza A by PCR POSITIVE (*)    All other components within normal limits  Radiology DG Chest Portable 1 View Result Date: 07/09/2023 CLINICAL DATA:  Cough.  Headache. EXAM: PORTABLE CHEST 1 VIEW COMPARISON:  11/27/2016. FINDINGS:  Bilateral lung fields are clear. Bilateral costophrenic angles are clear. Normal cardio-mediastinal silhouette. No acute osseous abnormalities. The soft tissues are within normal limits. IMPRESSION: No active disease. Electronically Signed   By: Jules Schick M.D.   On: 07/09/2023 10:54    Pertinent labs & imaging results that were available during my care of the patient were reviewed by me and considered in my medical decision making (see MDM for details).  Medications Ordered in ED Medications  ketorolac (TORADOL) 15 MG/ML injection 15 mg (has no administration in time range)                                                                                                                                     Procedures Procedures  (including critical care time)  Medical Decision Making / ED Course    Medical Decision Making:    Adrian Cole is a 58 y.o. male with past medical history as below, significant for gout, hypertension who presents to the ED with complaint of cough, URI symptoms. The complaint involves an extensive differential diagnosis and also carries with it a high risk of complications and morbidity.  Serious etiology was considered. Ddx includes but is not limited to: Influenza, pneumonia, COVID, etc.  Complete initial physical exam performed, notably the patient was in distress, no hypoxia.    Reviewed and confirmed nursing documentation for past medical history, family history, social history.  Vital signs reviewed.    Clinical Course as of 07/09/23 1143  Sun Jul 09, 2023  1110 Influenza A By PCR(!): POSITIVE [SG]    Clinical Course User Index [SG] Sloan Leiter, DO    Brief summary: 57 year old male with history as above here with URI symptoms, found of the flu.  Girlfriend with similar symptoms.  He is not hypoxic, lungs clear to auscultation.  No history of underlying lung disease per the patient.  Encourage furtive care at home, rehydration, work note.   Will give antitussive, decongestant.  Encourage outpatient follow-up PCP.  Discussed tricked return precautions  The patient improved significantly and was discharged in stable condition. Detailed discussions were had with the patient/guardian regarding current findings, and need for close f/u with PCP or on call doctor. The patient/guardian has been instructed to return immediately if the symptoms worsen in any way for re-evaluation. Patient/guardian verbalized understanding and is in agreement with current care plan. All questions answered prior to discharge.                  Additional history obtained: -Additional history obtained from na -External records from outside source obtained and reviewed including: Chart review including previous notes, labs, imaging, consultation notes including  PDMP, prior ED visits, home medications   Lab Tests: -I ordered, reviewed, and interpreted labs.  The pertinent results include:   Labs Reviewed  RESP PANEL BY RT-PCR (RSV, FLU A&B, COVID)  RVPGX2 - Abnormal; Notable for the following components:      Result Value   Influenza A by PCR POSITIVE (*)    All other components within normal limits    Notable for flu  EKG   EKG Interpretation Date/Time:    Ventricular Rate:    PR Interval:    QRS Duration:    QT Interval:    QTC Calculation:   R Axis:      Text Interpretation:           Imaging Studies ordered: I ordered imaging studies including CXR I independently visualized the following imaging with scope of interpretation limited to determining acute life threatening conditions related to emergency care; findings noted above I independently visualized and interpreted imaging. I agree with the radiologist interpretation   Medicines ordered and prescription drug management: Meds ordered this encounter  Medications   ketorolac (TORADOL) 15 MG/ML injection 15 mg   guaiFENesin-codeine 100-10 MG/5ML syrup    Sig: Take  10 mLs by mouth 2 (two) times daily as needed for cough.    Dispense:  120 mL    Refill:  0   acetaminophen (TYLENOL) 325 MG tablet    Sig: Take 2 tablets (650 mg total) by mouth every 6 (six) hours as needed.    Dispense:  36 tablet    Refill:  0   ibuprofen (ADVIL) 600 MG tablet    Sig: Take 1 tablet (600 mg total) by mouth every 6 (six) hours as needed.    Dispense:  30 tablet    Refill:  0   ondansetron (ZOFRAN) 4 MG tablet    Sig: Take 1 tablet (4 mg total) by mouth every 8 (eight) hours as needed for nausea or vomiting.   oxymetazoline (AFRIN NASAL SPRAY) 0.05 % nasal spray    Sig: Place 1 spray into both nostrils 2 (two) times daily for 3 days.    Dispense:  15 mL    Refill:  0    -I have reviewed the patients home medicines and have made adjustments as needed   Consultations Obtained: na   Cardiac Monitoring: The patient was maintained on a cardiac monitor.  I personally viewed and interpreted the cardiac monitored which showed an underlying rhythm of: NSR Continuous pulse oximetry interpreted by myself, 98% on RA.    Social Determinants of Health:  Diagnosis or treatment significantly limited by social determinants of health: obesity   Reevaluation: After the interventions noted above, I reevaluated the patient and found that they have improved  Co morbidities that complicate the patient evaluation  Past Medical History:  Diagnosis Date   Gout    Hypertension       Dispostion: Disposition decision including need for hospitalization was considered, and patient discharged from emergency department.    Final Clinical Impression(s) / ED Diagnoses Final diagnoses:  Influenza A        Sloan Leiter, DO 07/09/23 1144
